# Patient Record
Sex: Female | Born: 1975 | Hispanic: Yes | Smoking: Never smoker
Health system: Southern US, Community
[De-identification: ages and names within clinical notes are randomized; demographics above are authoritative.]

## PROBLEM LIST (undated history)

## (undated) DIAGNOSIS — D649 Anemia, unspecified: Secondary | ICD-10-CM

## (undated) DIAGNOSIS — F419 Anxiety disorder, unspecified: Secondary | ICD-10-CM

## (undated) DIAGNOSIS — N840 Polyp of corpus uteri: Secondary | ICD-10-CM

## (undated) DIAGNOSIS — F32A Depression, unspecified: Secondary | ICD-10-CM

## (undated) DIAGNOSIS — G250 Essential tremor: Secondary | ICD-10-CM

## (undated) DIAGNOSIS — F909 Attention-deficit hyperactivity disorder, unspecified type: Secondary | ICD-10-CM

## (undated) DIAGNOSIS — R251 Tremor, unspecified: Secondary | ICD-10-CM

## (undated) HISTORY — DX: Tremor, unspecified: R25.1

## (undated) HISTORY — DX: Depression, unspecified: F32.A

## (undated) HISTORY — DX: Anxiety disorder, unspecified: F41.9

## (undated) HISTORY — DX: Attention-deficit hyperactivity disorder, unspecified type: F90.9

---

## 2018-08-29 NOTE — H&P (Signed)
Alvia GroveLissette Vanderford  DICTATION # [id number] CSN# [id number]   Meriel Picaichard M Gillie Crisci,

## 2018-08-30 NOTE — H&P (Signed)
NAME: Victoria Parker, Eulalie MEDICAL RECORD ZO:[id number] ACCOUNT [id number] DATE OF BIRTH:July 20, 1976 FACILITY: [facility] LOCATION:  PHYSICIAN:Tanis Hensarling Milana ObeyM. Latosha Gaylord, MD  HISTORY AND PHYSICAL  DATE OF ADMISSION:  09/11/2018  CHIEF COMPLAINT:  Menorrhagia, endometrial polyps, request permanent sterilization.  HISTORY OF PRESENT ILLNESS:  A 42 year old G2 P2, currently using condoms for contraception.  [place] for anemia and menorrhagia with a hemoglobin of 9.7.  Prolactin and estradiol normal, FSH 39.9.   I also did a CEA and CA-125 that were normal 04/2018.  Ultrasound showed that she had a small left serosal fibroid and endometrial polyps.  After our exam, she also requested tubal ligation at the time of D and C hysteroscopy.  This procedure including  the permanence of procedure failure rate of 2-3 per thousand, other risks regarding bleeding, infection, possible need for open surgery discussed with her.  The D and C MyoSure procedure reviewed likewise including risk of bleeding, infection or  requiring additional surgery, which she consented to.  PAST MEDICAL HISTORY:    ALLERGIES:  None.  MEDICATIONS:  Propranolol 10 mg.  PAST SURGICAL HISTORY:  Cesarean section x2.  SOCIAL HISTORY:  Denies alcohol, tobacco or drug use.  She is married.  REVIEW OF SYSTEMS:  Positive for anemia.  FAMILY HISTORY:  Otherwise negative.  PHYSICAL EXAMINATION: VITAL SIGNS:  Temperature 98.2, blood pressure 120/72. HEENT:  Unremarkable. NECK:  Supple, without masses. LUNGS:  Clear. HEART:  Regular rate and rhythm without murmurs, rubs or gallops noted. BREASTS:  Without masses. ABDOMEN:  Soft, flat, nontender. PELVIC:  Vulva, vagina, cervix normal.  Uterus mid position, nontender.  Adnexa negative. NEUROLOGIC:  Unremarkable.  IMPRESSION: 1.  Endometrial polyps with menorrhagia and history of anemia. 2.  Request permanent sterilization.  PLAN:  D  and C, hysteroscopy with MyoSure, laparoscopic tubal ligation with Filshie clips.  Procedure and risks discussed as above.  TN/NUANCE  D:08/29/2018 T:08/29/2018 JOB:[id number]

## 2018-09-05 ENCOUNTER — Encounter (HOSPITAL_BASED_OUTPATIENT_CLINIC_OR_DEPARTMENT_OTHER): Payer: Self-pay | Admitting: *Deleted

## 2018-09-05 ENCOUNTER — Other Ambulatory Visit: Payer: Self-pay

## 2018-09-05 NOTE — Progress Notes (Addendum)
Spoke w/ pt via phone for pre-op interview.  Npo after mn.  Arrive at 0530.  Needs cbc and urine preg. Will take propranolol am dos w/ sips of water.  Called and spoke w/ heather, or scheduler, to ask dr Marcelle Overlieholland if T&S needed.  ADDENDUM:  Received called back from heather,  Per dr Marcelle Overlieholland to T&S needed, ordered d/c'd in epic.

## 2018-09-10 NOTE — Anesthesia Preprocedure Evaluation (Addendum)
Anesthesia Evaluation  Patient identified by MRN, date of birth, ID band Patient awake    Reviewed: Allergy & Precautions, NPO status , Patient's Chart, lab work & pertinent test results  History of Anesthesia Complications Negative for: history of anesthetic complications  Airway Mallampati: I  TM Distance: >3 FB Neck ROM: Full    Dental  (+) Dental Advisory Given, Teeth Intact   Pulmonary neg pulmonary ROS,    breath sounds clear to auscultation       Cardiovascular negative cardio ROS   Rhythm:Regular Rate:Normal     Neuro/Psych  Essential tremor  negative psych ROS   GI/Hepatic negative GI ROS, Neg liver ROS,   Endo/Other  negative endocrine ROS  Renal/GU negative Renal ROS     Musculoskeletal negative musculoskeletal ROS (+)   Abdominal   Peds  Hematology  (+) anemia ,   Anesthesia Other Findings   Reproductive/Obstetrics                            Anesthesia Physical Anesthesia Plan  ASA: I  Anesthesia Plan: General   Post-op Pain Management:    Induction: Intravenous  PONV Risk Score and Plan: 4 or greater and Treatment may vary due to age or medical condition, Ondansetron, Scopolamine patch - Pre-op, Dexamethasone and Midazolam  Airway Management Planned: Oral ETT  Additional Equipment: None  Intra-op Plan:   Post-operative Plan: Extubation in OR  Informed Consent: I have reviewed the patients History and Physical, chart, labs and discussed the procedure including the risks, benefits and alternatives for the proposed anesthesia with the patient or authorized representative who has indicated his/her understanding and acceptance.   Dental advisory given  Plan Discussed with: CRNA and Anesthesiologist  Anesthesia Plan Comments:        Anesthesia Quick Evaluation

## 2018-09-11 ENCOUNTER — Observation Stay (HOSPITAL_BASED_OUTPATIENT_CLINIC_OR_DEPARTMENT_OTHER)
Admission: RE | Admit: 2018-09-11 | Discharge: 2018-09-11 | Disposition: A | Discharge status: Home / Self Care | Payer: 59 | Source: Ambulatory Visit | Attending: Obstetrics and Gynecology | Admitting: Obstetrics and Gynecology

## 2018-09-11 ENCOUNTER — Observation Stay (HOSPITAL_BASED_OUTPATIENT_CLINIC_OR_DEPARTMENT_OTHER): Payer: 59 | Admitting: Anesthesiology

## 2018-09-11 ENCOUNTER — Other Ambulatory Visit: Payer: Self-pay

## 2018-09-11 ENCOUNTER — Encounter (HOSPITAL_BASED_OUTPATIENT_CLINIC_OR_DEPARTMENT_OTHER): Payer: Self-pay | Admitting: *Deleted

## 2018-09-11 ENCOUNTER — Encounter (HOSPITAL_BASED_OUTPATIENT_CLINIC_OR_DEPARTMENT_OTHER)
Admission: RE | Disposition: A | Discharge status: Home / Self Care | Payer: Self-pay | Source: Ambulatory Visit | Attending: Obstetrics and Gynecology

## 2018-09-11 DIAGNOSIS — N840 Polyp of corpus uteri: Secondary | ICD-10-CM | POA: Diagnosis not present

## 2018-09-11 DIAGNOSIS — N920 Excessive and frequent menstruation with regular cycle: Principal | ICD-10-CM | POA: Diagnosis present

## 2018-09-11 DIAGNOSIS — Z302 Encounter for sterilization: Secondary | ICD-10-CM | POA: Insufficient documentation

## 2018-09-11 DIAGNOSIS — N736 Female pelvic peritoneal adhesions (postinfective): Secondary | ICD-10-CM | POA: Diagnosis not present

## 2018-09-11 HISTORY — DX: Polyp of corpus uteri: N84.0

## 2018-09-11 HISTORY — PX: DILATATION & CURETTAGE/HYSTEROSCOPY WITH MYOSURE: SHX6511

## 2018-09-11 HISTORY — PX: LAPAROSCOPIC TUBAL LIGATION: SHX1937

## 2018-09-11 HISTORY — DX: Anemia, unspecified: D64.9

## 2018-09-11 HISTORY — DX: Essential tremor: G25.0

## 2018-09-11 LAB — CBC
HCT: 32 % — ABNORMAL LOW (ref 36.0–46.0)
Hemoglobin: 9.9 g/dL — ABNORMAL LOW (ref 12.0–15.0)
MCH: 26.9 pg (ref 26.0–34.0)
MCHC: 30.9 g/dL (ref 30.0–36.0)
MCV: 87 fL (ref 80.0–100.0)
PLATELETS: 283 10*3/uL (ref 150–400)
RBC: 3.68 MIL/uL — ABNORMAL LOW (ref 3.87–5.11)
RDW: 13.4 % (ref 11.5–15.5)
WBC: 5.6 10*3/uL (ref 4.0–10.5)
nRBC: 0 % (ref 0.0–0.2)

## 2018-09-11 LAB — POCT PREGNANCY, URINE: PREG TEST UR: NEGATIVE

## 2018-09-11 SURGERY — LIGATION, FALLOPIAN TUBE, LAPAROSCOPIC
Anesthesia: General

## 2018-09-11 MED ORDER — FENTANYL CITRATE (PF) 100 MCG/2ML IJ SOLN
INTRAMUSCULAR | Status: DC | PRN
  Administered 2018-09-11: 25 ug via INTRAVENOUS
  Administered 2018-09-11: 50 ug via INTRAVENOUS
  Administered 2018-09-11: 25 ug via INTRAVENOUS

## 2018-09-11 MED ORDER — FENTANYL CITRATE (PF) 100 MCG/2ML IJ SOLN
INTRAMUSCULAR | Status: AC
  Filled 2018-09-11: qty 2

## 2018-09-11 MED ORDER — PROMETHAZINE HCL 25 MG/ML IJ SOLN
INTRAMUSCULAR | Status: AC
  Filled 2018-09-11: qty 1

## 2018-09-11 MED ORDER — FENTANYL CITRATE (PF) 100 MCG/2ML IJ SOLN
25.0000 ug | INTRAMUSCULAR | Status: DC | PRN
  Administered 2018-09-11: 50 ug via INTRAVENOUS
  Administered 2018-09-11: 25 ug via INTRAVENOUS
  Filled 2018-09-11: qty 1

## 2018-09-11 MED ORDER — DEXAMETHASONE SODIUM PHOSPHATE 10 MG/ML IJ SOLN
INTRAMUSCULAR | Status: DC | PRN
  Administered 2018-09-11: 10 mg via INTRAVENOUS

## 2018-09-11 MED ORDER — PROPOFOL 10 MG/ML IV BOLUS
INTRAVENOUS | Status: AC
  Filled 2018-09-11: qty 20

## 2018-09-11 MED ORDER — OXYCODONE HCL 5 MG/5ML PO SOLN
5.0000 mg | Freq: Once | ORAL | Status: AC | PRN
  Filled 2018-09-11: qty 5

## 2018-09-11 MED ORDER — KETOROLAC TROMETHAMINE 30 MG/ML IJ SOLN
INTRAMUSCULAR | Status: AC
  Filled 2018-09-11: qty 1

## 2018-09-11 MED ORDER — ONDANSETRON HCL 4 MG/2ML IJ SOLN
INTRAMUSCULAR | Status: AC
  Filled 2018-09-11: qty 4

## 2018-09-11 MED ORDER — DEXTROSE IN LACTATED RINGERS 5 % IV SOLN
INTRAVENOUS | Status: DC
  Filled 2018-09-11: qty 1000

## 2018-09-11 MED ORDER — PROMETHAZINE HCL 25 MG/ML IJ SOLN
6.2500 mg | INTRAMUSCULAR | Status: DC | PRN
  Administered 2018-09-11: 6.25 mg via INTRAVENOUS
  Filled 2018-09-11: qty 1

## 2018-09-11 MED ORDER — ROCURONIUM BROMIDE 10 MG/ML (PF) SYRINGE
PREFILLED_SYRINGE | INTRAVENOUS | Status: AC
  Filled 2018-09-11: qty 10

## 2018-09-11 MED ORDER — LACTATED RINGERS IV SOLN
INTRAVENOUS | Status: DC
  Administered 2018-09-11 (×3): via INTRAVENOUS
  Filled 2018-09-11: qty 1000

## 2018-09-11 MED ORDER — PROPOFOL 10 MG/ML IV BOLUS
INTRAVENOUS | Status: DC | PRN
  Administered 2018-09-11: 140 mg via INTRAVENOUS

## 2018-09-11 MED ORDER — SCOPOLAMINE 1 MG/3DAYS TD PT72
MEDICATED_PATCH | TRANSDERMAL | Status: DC | PRN
  Administered 2018-09-11: 1 via TRANSDERMAL

## 2018-09-11 MED ORDER — SCOPOLAMINE 1 MG/3DAYS TD PT72
MEDICATED_PATCH | TRANSDERMAL | Status: AC
  Filled 2018-09-11: qty 1

## 2018-09-11 MED ORDER — MIDAZOLAM HCL 5 MG/5ML IJ SOLN
INTRAMUSCULAR | Status: DC | PRN
  Administered 2018-09-11: 2 mg via INTRAVENOUS

## 2018-09-11 MED ORDER — SUGAMMADEX SODIUM 200 MG/2ML IV SOLN
INTRAVENOUS | Status: DC | PRN
  Administered 2018-09-11: 100 mg via INTRAVENOUS

## 2018-09-11 MED ORDER — LIDOCAINE 2% (20 MG/ML) 5 ML SYRINGE
INTRAMUSCULAR | Status: DC | PRN
  Administered 2018-09-11: 80 mg via INTRAVENOUS

## 2018-09-11 MED ORDER — OXYCODONE-ACETAMINOPHEN 5-325 MG PO TABS: 1.0000 | ORAL_TABLET | ORAL | 0 refills | Status: DC | PRN

## 2018-09-11 MED ORDER — SUGAMMADEX SODIUM 200 MG/2ML IV SOLN
INTRAVENOUS | Status: AC
  Filled 2018-09-11: qty 2

## 2018-09-11 MED ORDER — SODIUM CHLORIDE 0.9 % IV SOLN
INTRAVENOUS | Status: AC
  Filled 2018-09-11: qty 2

## 2018-09-11 MED ORDER — SODIUM CHLORIDE 0.9 % IV SOLN
2.0000 g | INTRAVENOUS | Status: AC
  Administered 2018-09-11: 2 g via INTRAVENOUS
  Filled 2018-09-11: qty 2

## 2018-09-11 MED ORDER — ROCURONIUM BROMIDE 100 MG/10ML IV SOLN
INTRAVENOUS | Status: DC | PRN
  Administered 2018-09-11: 50 mg via INTRAVENOUS

## 2018-09-11 MED ORDER — SODIUM CHLORIDE (PF) 0.9 % IJ SOLN
INTRAMUSCULAR | Status: AC
  Filled 2018-09-11: qty 10

## 2018-09-11 MED ORDER — DEXAMETHASONE SODIUM PHOSPHATE 10 MG/ML IJ SOLN
INTRAMUSCULAR | Status: AC
  Filled 2018-09-11: qty 1

## 2018-09-11 MED ORDER — MIDAZOLAM HCL 2 MG/2ML IJ SOLN
INTRAMUSCULAR | Status: AC
  Filled 2018-09-11: qty 2

## 2018-09-11 MED ORDER — LIDOCAINE HCL 1 % IJ SOLN
INTRAMUSCULAR | Status: DC | PRN
  Administered 2018-09-11: 10 mL

## 2018-09-11 MED ORDER — KETOROLAC TROMETHAMINE 30 MG/ML IJ SOLN
INTRAMUSCULAR | Status: DC | PRN
  Administered 2018-09-11: 30 mg via INTRAVENOUS

## 2018-09-11 MED ORDER — OXYCODONE HCL 5 MG PO TABS
5.0000 mg | ORAL_TABLET | Freq: Once | ORAL | Status: AC | PRN
  Administered 2018-09-11: 5 mg via ORAL
  Filled 2018-09-11: qty 1

## 2018-09-11 MED ORDER — IBUPROFEN 200 MG PO TABS: 800.0000 mg | ORAL_TABLET | Freq: Three times a day (TID) | ORAL | 1 refills | Status: AC | PRN

## 2018-09-11 MED ORDER — ONDANSETRON HCL 4 MG/2ML IJ SOLN
INTRAMUSCULAR | Status: DC | PRN
  Administered 2018-09-11 (×2): 4 mg via INTRAVENOUS

## 2018-09-11 MED ORDER — SODIUM CHLORIDE 0.9 % IR SOLN
Status: DC | PRN
  Administered 2018-09-11: 3000 mL

## 2018-09-11 MED ORDER — BUPIVACAINE HCL (PF) 0.5 % IJ SOLN
INTRAMUSCULAR | Status: DC | PRN
  Administered 2018-09-11: 25 mL

## 2018-09-11 MED ORDER — OXYCODONE HCL 5 MG PO TABS
ORAL_TABLET | ORAL | Status: AC
  Filled 2018-09-11: qty 1

## 2018-09-11 MED ORDER — PHENYLEPHRINE 40 MCG/ML (10ML) SYRINGE FOR IV PUSH (FOR BLOOD PRESSURE SUPPORT)
PREFILLED_SYRINGE | INTRAVENOUS | Status: DC | PRN
  Administered 2018-09-11: 80 ug via INTRAVENOUS
  Administered 2018-09-11 (×3): 40 ug via INTRAVENOUS

## 2018-09-11 MED ORDER — LIDOCAINE 2% (20 MG/ML) 5 ML SYRINGE
INTRAMUSCULAR | Status: AC
  Filled 2018-09-11: qty 5

## 2018-09-11 SURGICAL SUPPLY — 54 items
CANISTER SUCT 3000ML PPV (MISCELLANEOUS) ×4 IMPLANT
CATH ROBINSON RED A/P 16FR (CATHETERS) ×4 IMPLANT
CLIP FILSHIE TUBAL LIGA STRL (Clip) ×4 IMPLANT
CLOTH BEACON ORANGE TIMEOUT ST (SAFETY) ×4 IMPLANT
COUNTER NEEDLE 1200 MAGNETIC (NEEDLE) ×4 IMPLANT
COVER MAYO STAND STRL (DRAPES) ×4 IMPLANT
COVER WAND RF STERILE (DRAPES) ×4 IMPLANT
DERMABOND ADVANCED (GAUZE/BANDAGES/DRESSINGS) ×2
DERMABOND ADVANCED .7 DNX12 (GAUZE/BANDAGES/DRESSINGS) ×2 IMPLANT
DEVICE MYOSURE LITE (MISCELLANEOUS) IMPLANT
DEVICE MYOSURE REACH (MISCELLANEOUS) IMPLANT
DILATOR CANAL MILEX (MISCELLANEOUS) IMPLANT
DRSG COVADERM PLUS 2X2 (GAUZE/BANDAGES/DRESSINGS) IMPLANT
DRSG OPSITE POSTOP 3X4 (GAUZE/BANDAGES/DRESSINGS) ×2 IMPLANT
DRSG TELFA 3X8 NADH (GAUZE/BANDAGES/DRESSINGS) IMPLANT
DURAPREP 26ML APPLICATOR (WOUND CARE) ×4 IMPLANT
ELECT REM PT RETURN 9FT ADLT (ELECTROSURGICAL) ×4
ELECTRODE REM PT RTRN 9FT ADLT (ELECTROSURGICAL) ×2 IMPLANT
GAUZE 4X4 16PLY RFD (DISPOSABLE) ×4 IMPLANT
GLOVE BIO SURGEON STRL SZ7 (GLOVE) ×8 IMPLANT
GLOVE BIOGEL PI IND STRL 7.0 (GLOVE) ×2 IMPLANT
GLOVE BIOGEL PI INDICATOR 7.0 (GLOVE) ×2
GOWN STRL REUS W/TWL LRG LVL3 (GOWN DISPOSABLE) ×8 IMPLANT
IV NS IRRIG 3000ML ARTHROMATIC (IV SOLUTION) ×8 IMPLANT
KIT PROCEDURE FLUENT (KITS) ×4 IMPLANT
KIT TURNOVER CYSTO (KITS) ×4 IMPLANT
MYOSURE XL FIBROID (MISCELLANEOUS)
NDL HYPO 25X1 1.5 SAFETY (NEEDLE) IMPLANT
NEEDLE HYPO 25X1 1.5 SAFETY (NEEDLE) ×4 IMPLANT
NEEDLE INSUFFLATION 120MM (ENDOMECHANICALS) ×4 IMPLANT
NS IRRIG 500ML POUR BTL (IV SOLUTION) ×4 IMPLANT
PACK LAPAROSCOPY BASIN (CUSTOM PROCEDURE TRAY) ×4 IMPLANT
PACK TRENDGUARD 450 HYBRID PRO (MISCELLANEOUS) IMPLANT
PACK VAGINAL MINOR WOMEN LF (CUSTOM PROCEDURE TRAY) ×4 IMPLANT
PAD DRESSING TELFA 3X8 NADH (GAUZE/BANDAGES/DRESSINGS) IMPLANT
PAD OB MATERNITY 4.3X12.25 (PERSONAL CARE ITEMS) ×4 IMPLANT
PAD PREP 24X48 CUFFED NSTRL (MISCELLANEOUS) ×4 IMPLANT
PADDING ION DISPOSABLE (MISCELLANEOUS) ×4 IMPLANT
SEAL ROD LENS SCOPE MYOSURE (ABLATOR) ×4 IMPLANT
SEALER TISSUE G2 CVD JAW 45CM (ENDOMECHANICALS) IMPLANT
SET IRRIG TUBING LAPAROSCOPIC (IRRIGATION / IRRIGATOR) IMPLANT
SUT MNCRL AB 3-0 PS2 18 (SUTURE) IMPLANT
SUT MNCRL AB 4-0 PS2 18 (SUTURE) ×4 IMPLANT
SUT VICRYL 0 UR6 27IN ABS (SUTURE) ×2 IMPLANT
SYR 10ML LL (SYRINGE) ×2 IMPLANT
SYSTEM TISS REMOVAL MYOSURE XL (MISCELLANEOUS) IMPLANT
TOWEL OR 17X24 6PK STRL BLUE (TOWEL DISPOSABLE) ×8 IMPLANT
TRENDGUARD 450 HYBRID PRO PACK (MISCELLANEOUS) ×4
TROCAR OPTI TIP 5M 100M (ENDOMECHANICALS) ×2 IMPLANT
TROCAR XCEL BLUNT TIP 100MML (ENDOMECHANICALS) IMPLANT
TROCAR XCEL DIL TIP R 11M (ENDOMECHANICALS) ×4 IMPLANT
TUBING INSUF HEATED (TUBING) ×4 IMPLANT
WARMER LAPAROSCOPE (MISCELLANEOUS) ×4 IMPLANT
WATER STERILE IRR 500ML POUR (IV SOLUTION) ×4 IMPLANT

## 2018-09-11 NOTE — Anesthesia Postprocedure Evaluation (Signed)
Anesthesia Post Note  Patient: Victoria Parker  Procedure(s) Performed: LAPAROSCOPIC TUBAL LIGATION with Filshie clips (Bilateral ) DILATATION & CURETTAGE/HYSTEROSCOPY (N/A )     Patient location during evaluation: PACU Anesthesia Type: General Level of consciousness: awake and alert Pain management: pain level controlled Vital Signs Assessment: post-procedure vital signs reviewed and stable Respiratory status: spontaneous breathing, nonlabored ventilation and respiratory function stable Cardiovascular status: blood pressure returned to baseline and stable Postop Assessment: no apparent nausea or vomiting Anesthetic complications: no    Last Vitals:  Vitals:   09/11/18 0930 09/11/18 0945  BP: 93/63 (!) 88/60  Pulse: (!) 56 (!) 58  Resp: 15 13  Temp:    SpO2: 100% 98%    Last Pain:  Vitals:   09/11/18 1145  TempSrc:   PainSc: 4                  Beryle Lathehomas E Brock

## 2018-09-11 NOTE — Op Note (Signed)
Preoperative diagnosis: Menorrhagia, pelvic pain, request permanent sterilization,  Postoperative diagnosis: Same plus pelvic adhesions  Procedure: The patient taken to the operating room after an adequate level of general anesthesia was obtained with patient's legs in stirrups the abdomen perineum and vagina were prepped and draped in the bladder was drained this was done after appropriate timeouts were taken.  Weighted speculum was positioned cervix grasped with a tenaculum paracervical block was then instituted infiltrating at 3 9:00 submucosally 5 to 7 cc of 1% plain Xylocaine at each side after negative aspiration.  Uterus sounded to 8 cm progressively dilated to a 25 Pratt dilator, the smaller.amateur diagnostic hysteroscope was in position good view of the fundus I did not specifically see any well-defined polyps there was some minimal endometrial buildup the cavity otherwise looked normal sharp curettage was carried out minimal tissue was sent to pathology.  Hulka tenaculum was positioned.   Attention directed to the abdomen where the subumbilical area was infiltrated with quarter percent Marcaine plain a small incision was made and the varies needle was introduced without difficulty.  After 2-1/2 L pneumoperitoneum was then created, lap scopic trocar and sleeve were then introduced without difficulty.  There was no evidence of any bleeding or trauma.  The patient was then placed in Trendelenburg with the uterus anteflexed, the findings as noted:   There was some omental adhesions into the lower anterior abdominal wall there was some significant left adnexal adhesions with the left fimbriated and stuck to the left pelvic sidewall and other adhesions on that left side the right side appeared to be normal cul-de-sac was free and clear.  Under direct visualization 3 fingerbreadths above the symphysis of 5 mm trocar was inserted for better traction and blunt probe was positioned.  Half percent Marcaine  plain were then dripped across each tube from the cornu to the fimbriated end 4 to 5 cc on each side.  The right tube was positively identified and traced to the fimbriated end, regrasped 2 cm from the cornua Filshie clip applied to the right angle completely engulfing the tube excellent application.  On the left side the tube was traced to the distal end which again was adherent into the left pelvic sidewall but this was positively identified and a Filshie clip applied 2 cm from the cornu at a right angle with good application.  This application was photo documented.  Prior to closure sponge, needle, instrument counts reported as correct x2.  Instruments were removed, gas allowed to escape, the upper incision closed with a 4-0 Monocryl subcuticular skin closure and Dermabond on the lower she tolerated this well went to recovery room in good condition.  Dictated with Dragon Medical 1  Meriel Picaichard M Ragan Duhon MD

## 2018-09-11 NOTE — Transfer of Care (Signed)
Immediate Anesthesia Transfer of Care Note  Patient: Victoria Parker  Procedure(s) Performed: LAPAROSCOPIC TUBAL LIGATION with Filshie clips (Bilateral ) DILATATION & CURETTAGE/HYSTEROSCOPY (N/A )  Patient Location: PACU  Anesthesia Type:General  Level of Consciousness: drowsy and patient cooperative  Airway & Oxygen Therapy: Patient Spontanous Breathing and Patient connected to face mask oxygen  Post-op Assessment: Report given to RN and Post -op Vital signs reviewed and stable  Post vital signs: Reviewed and stable  Last Vitals:  Vitals Value Taken Time  BP 100/66 09/11/2018  8:47 AM  Temp    Pulse 80 09/11/2018  8:50 AM  Resp 14 09/11/2018  8:50 AM  SpO2 100 % 09/11/2018  8:50 AM  Vitals shown include unvalidated device data.  Last Pain:  Vitals:   09/11/18 0556  TempSrc:   PainSc: 0-No pain      Patients Stated Pain Goal: 7 (09/11/18 0556)  Complications: No apparent anesthesia complications

## 2018-09-11 NOTE — Discharge Instructions (Signed)
DISCHARGE INSTRUCTIONS: D&C hysteroscopy/ bilateral tubal ligation/ ablation  **You may begin taking Ibuprofen/Motrin/Advil after 2:23pm today**  The following instructions have been prepared to help you care for yourself upon your return home today.  Wound care:  Do not get the incision wet for the first 24 hours. The incision should be kept clean and dry.  The Band-Aids or dressings may be removed the 2nd day after surgery(Wednesday).  Should the incision become sore, red, and swollen after the first week, check with your doctor.  Personal hygiene:  Shower the day after your procedure.  Activity and limitations:  Do NOT drive or operate any equipment today.  Do NOT lift anything more than 15 pounds for 2-3 weeks after surgery.  Do NOT rest in bed all day.  Walking is encouraged. Walk each day, starting slowly with 5-minute walks 3 or 4 times a day. Slowly increase the length of your walks.  Walk up and down stairs slowly.  Do NOT do strenuous activities, such as golfing, playing tennis, bowling, running, biking, weight lifting, gardening, mowing, or vacuuming for 2-4 weeks. Ask your doctor when it is okay to start.  Diet: Eat a light meal as desired this evening. You may resume your usual diet tomorrow.  Return to work: This is dependent on the type of work you do. For the most part you can return to a desk job within a week of surgery. If you are more active at work, please discuss this with your doctor.  What to expect after your surgery: You may have a slight burning sensation when you urinate on the first day. You may have a very small amount of blood in the urine. Expect to have a small amount of vaginal discharge/light bleeding for 1-2 weeks. It is not unusual to have abdominal soreness and bruising for up to 2 weeks. You may be tired and need more rest for about 1 week. You may experience shoulder pain for 24-72 hours. Lying flat in bed may relieve it.  Call your doctor  for any of the following:  Develop a fever of 100.4 or greater  Inability to urinate 6 hours after discharge from hospital  Severe pain not relieved by pain medications  Persistent of heavy bleeding at incision site  Redness or swelling around incision site after a week  Increasing nausea or vomiting    Post Anesthesia Home Care Instructions  Activity: Get plenty of rest for the remainder of the day. A responsible adult should stay with you for 24 hours following the procedure.  For the next 24 hours, DO NOT: -Drive a car -Advertising copywriter -Drink alcoholic beverages -Take any medication unless instructed by your physician -Make any legal decisions or sign important papers.  Meals: Start with liquid foods such as gelatin or soup. Progress to regular foods as tolerated. Avoid greasy, spicy, heavy foods. If nausea and/or vomiting occur, drink only clear liquids until the nausea and/or vomiting subsides. Call your physician if vomiting continues.  Special Instructions/Symptoms: Your throat may feel dry or sore from the anesthesia or the breathing tube placed in your throat during surgery. If this causes discomfort, gargle with warm salt water. The discomfort should disappear within 24 hours.  If you had a scopolamine patch placed behind your ear for the management of post- operative nausea and/or vomiting:  1. The medication in the patch is effective for 72 hours, after which it should be removed.  Wrap patch in a tissue and discard in the trash.  Wash hands thoroughly with soap and water. 2. You may remove the patch earlier than 72 hours if you experience unpleasant side effects which may include dry mouth, dizziness or visual disturbances. 3. Avoid touching the patch. Wash your hands with soap and water after contact with the patch.

## 2018-09-11 NOTE — Progress Notes (Signed)
The patient was re-examined with no change in status 

## 2018-09-11 NOTE — Anesthesia Procedure Notes (Signed)
Procedure Name: Intubation Date/Time: 09/11/2018 7:36 AM Performed by: Marny Lowensteinapozzi, Autym Siess W, CRNA Pre-anesthesia Checklist: Patient identified, Emergency Drugs available, Suction available and Patient being monitored Patient Re-evaluated:Patient Re-evaluated prior to induction Oxygen Delivery Method: Circle system utilized Preoxygenation: Pre-oxygenation with 100% oxygen Induction Type: IV induction Ventilation: Mask ventilation without difficulty Laryngoscope Size: Miller and 2 Grade View: Grade I Tube type: Oral Tube size: 7.0 mm Number of attempts: 1 Airway Equipment and Method: Stylet Placement Confirmation: ETT inserted through vocal cords under direct vision,  positive ETCO2 and breath sounds checked- equal and bilateral Secured at: 20 cm Tube secured with: Tape Dental Injury: Teeth and Oropharynx as per pre-operative assessment

## 2018-09-12 ENCOUNTER — Encounter (HOSPITAL_BASED_OUTPATIENT_CLINIC_OR_DEPARTMENT_OTHER): Payer: Self-pay | Admitting: Obstetrics and Gynecology

## 2020-04-01 ENCOUNTER — Other Ambulatory Visit: Payer: Self-pay | Admitting: Endocrinology

## 2020-04-01 DIAGNOSIS — Z1231 Encounter for screening mammogram for malignant neoplasm of breast: Secondary | ICD-10-CM

## 2020-04-09 ENCOUNTER — Other Ambulatory Visit: Payer: Self-pay

## 2020-04-09 ENCOUNTER — Ambulatory Visit
Admission: RE | Admit: 2020-04-09 | Discharge: 2020-04-09 | Disposition: A | Discharge status: Home / Self Care | Payer: PRIVATE HEALTH INSURANCE | Source: Ambulatory Visit | Attending: Endocrinology | Admitting: Endocrinology

## 2020-04-09 DIAGNOSIS — Z1231 Encounter for screening mammogram for malignant neoplasm of breast: Secondary | ICD-10-CM

## 2021-08-17 IMAGING — MG DIGITAL SCREENING BILAT W/ TOMO W/ CAD
8 series · 8 of 24 positions shown · non-contrast
Comparison: None.

CLINICAL DATA: Screening.

EXAM:
DIGITAL SCREENING BILATERAL MAMMOGRAM WITH TOMO AND CAD

[R MLO synth-2D]
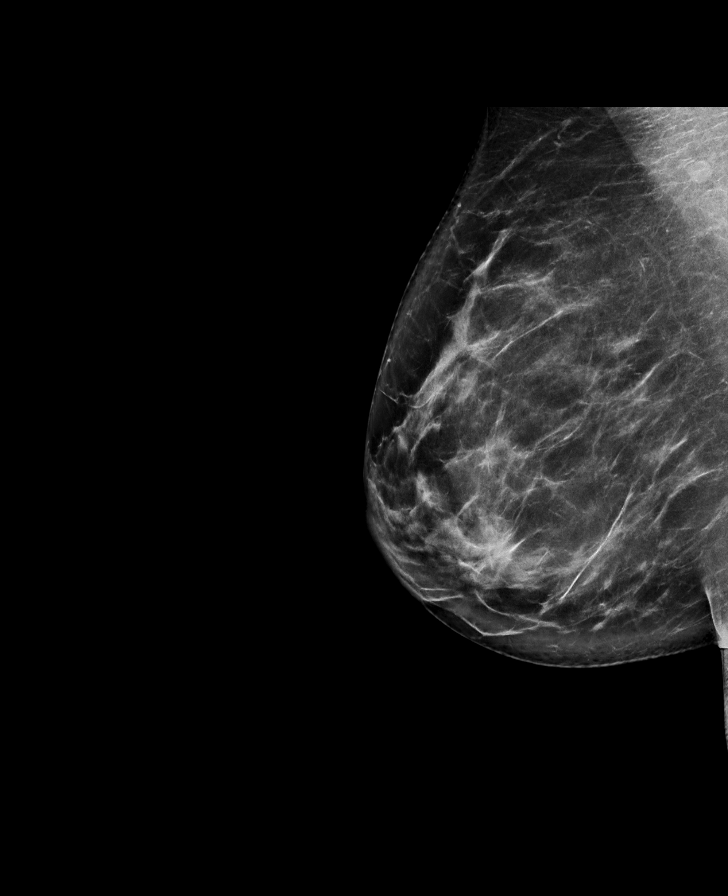

[L MLO synth-2D]
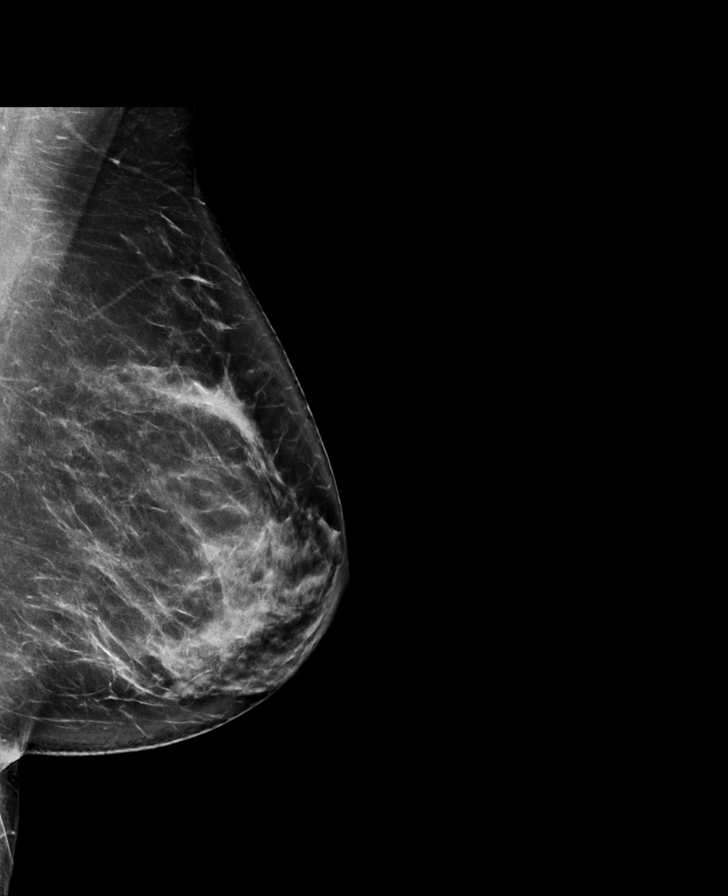

[L CC synth-2D]
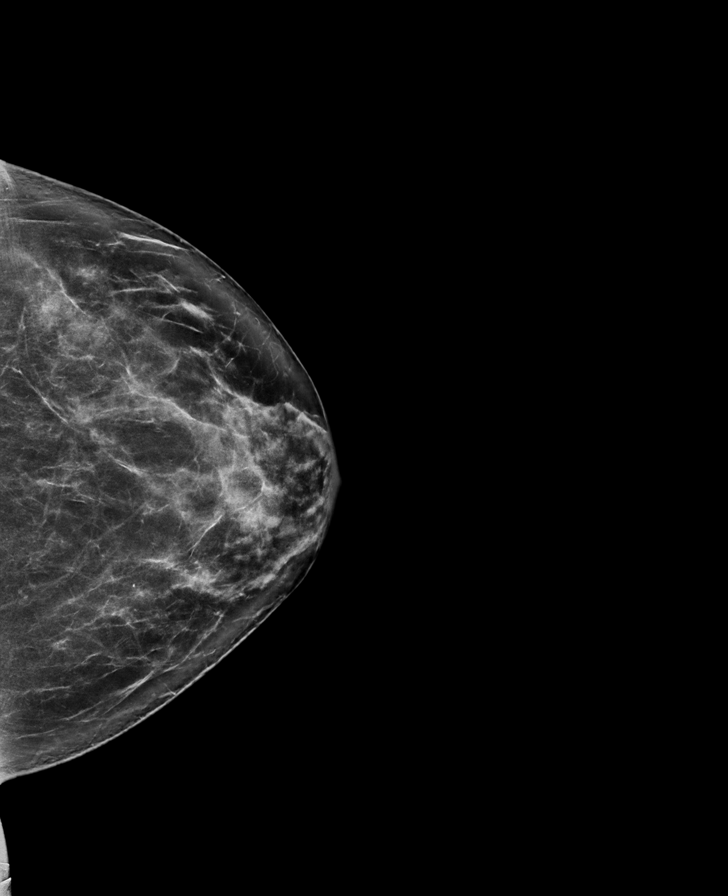

[R CC synth-2D]
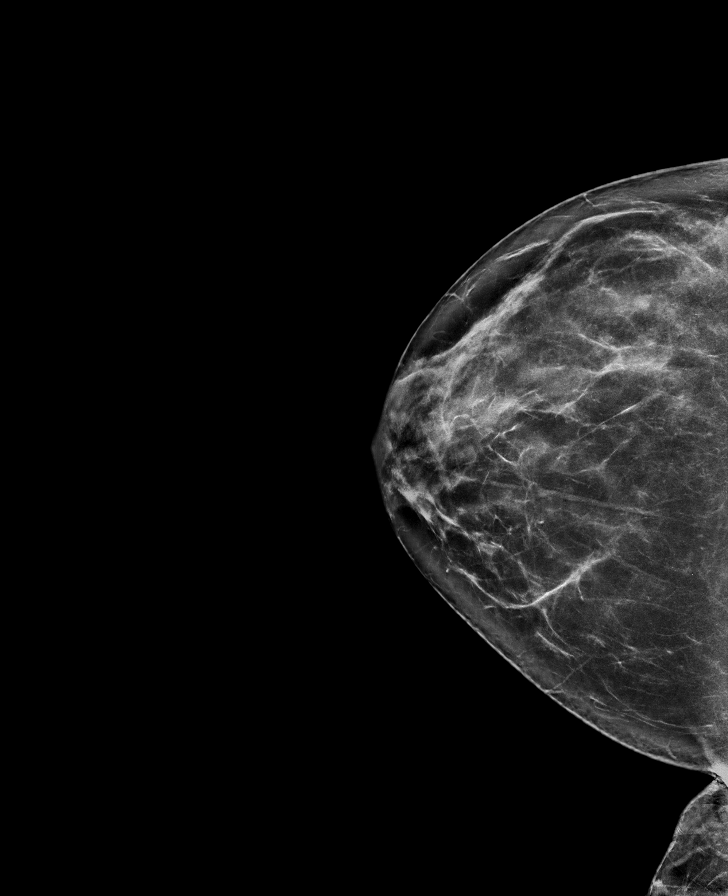

[R MLO tomo · tomo slice 47/92.0]
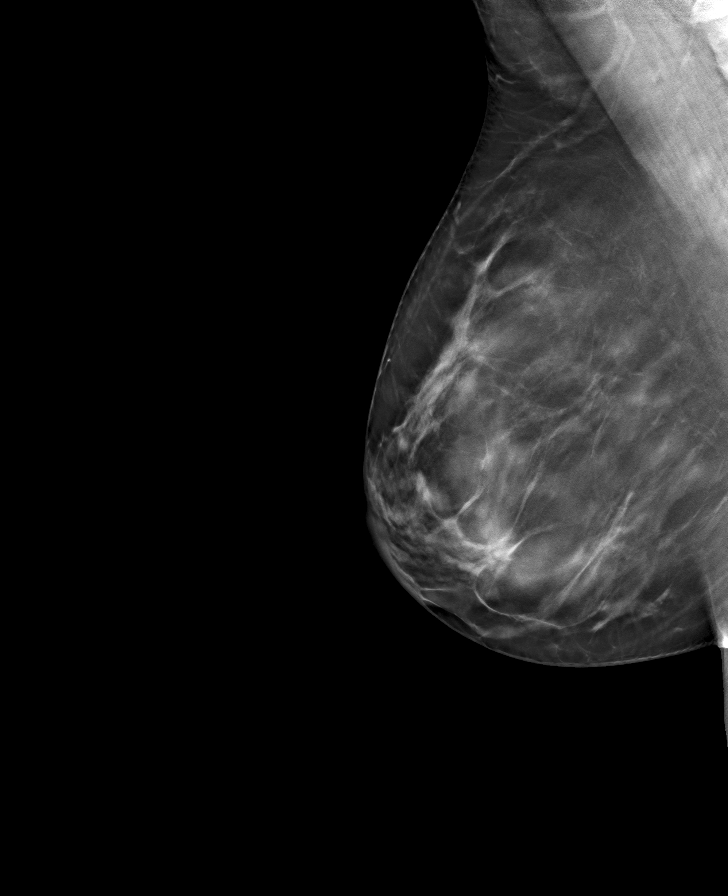

[R CC tomo · tomo slice 43/84.0]
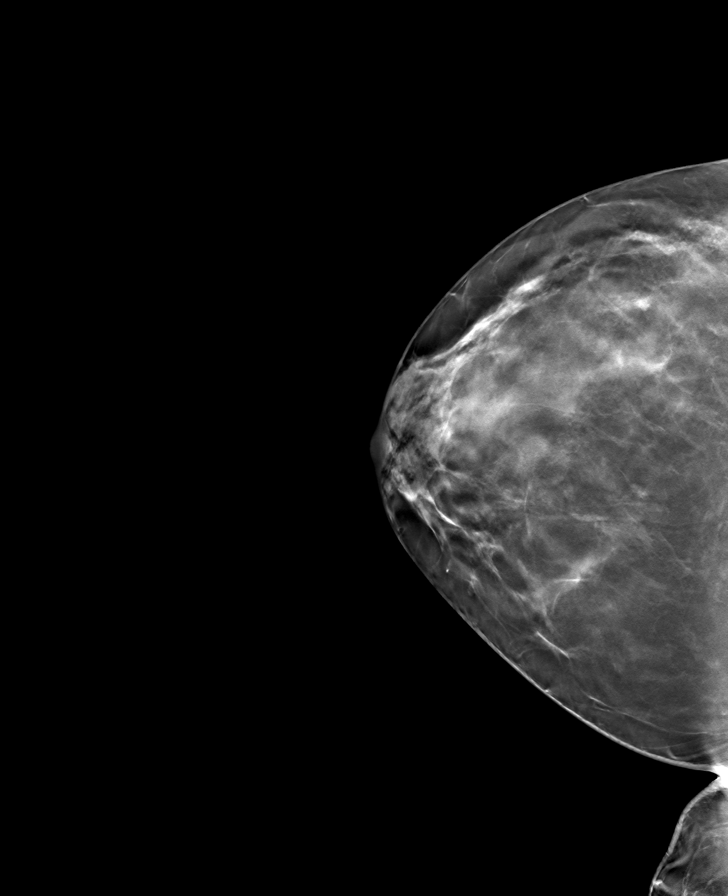

[L CC tomo · tomo slice 42/83.0]
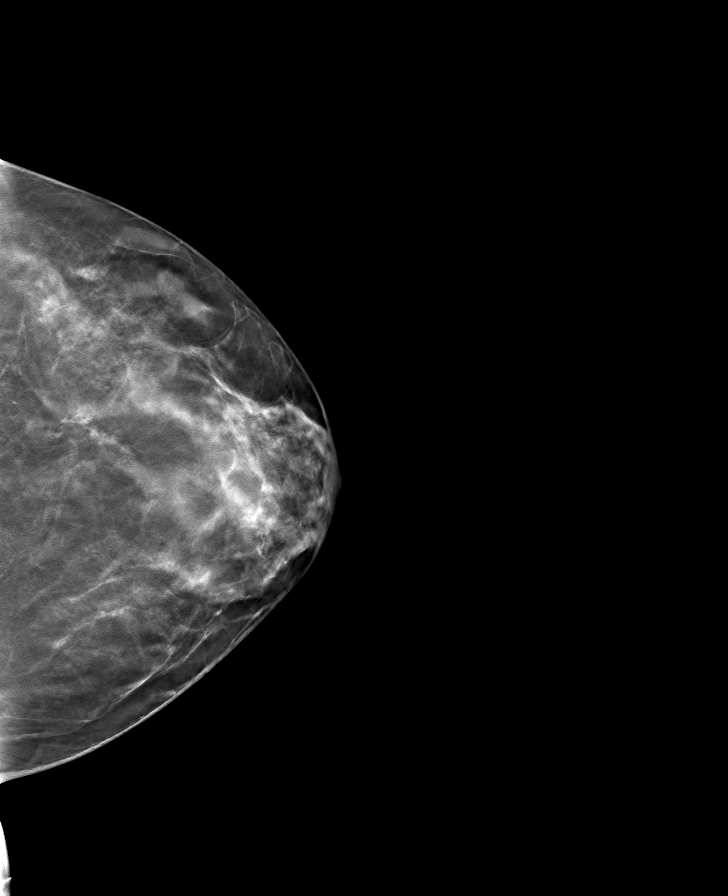

[L MLO tomo · tomo slice 45/88.0]
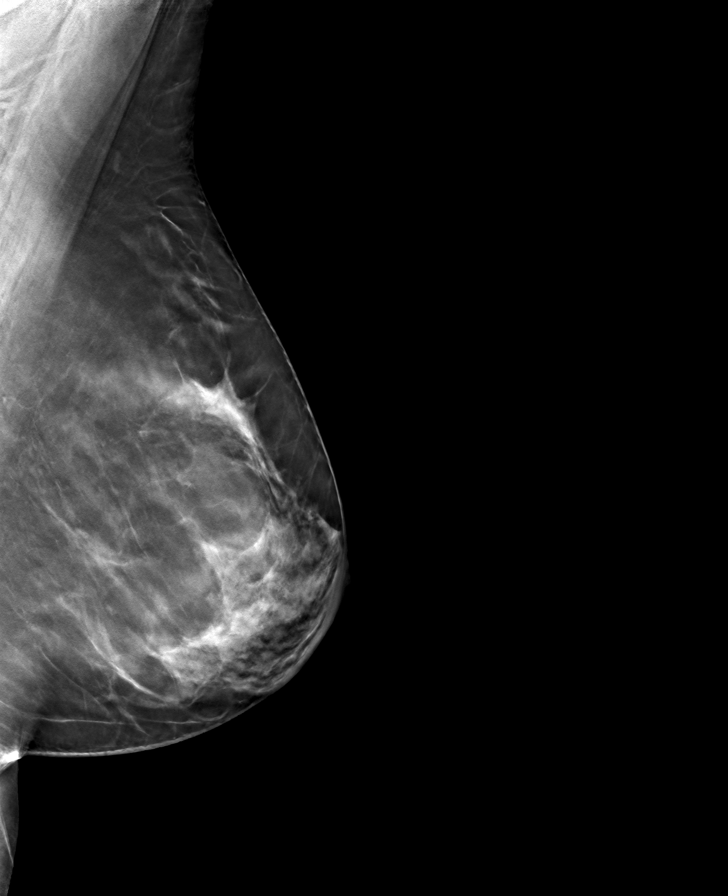

[8 of 24 positions shown; findings below may reference images not displayed]

ACR Breast Density Category b: There are scattered areas of
fibroglandular density.
FINDINGS: There are no findings suspicious for malignancy. Images were
processed with CAD.
IMPRESSION: No mammographic evidence of malignancy. A result letter of this
screening mammogram will be mailed directly to the patient.

RECOMMENDATION:
Screening mammogram in one year. (Code:Y5-G-EJ6)

BI-RADS CATEGORY  1: Negative.

## 2022-10-07 DIAGNOSIS — F4321 Adjustment disorder with depressed mood: Secondary | ICD-10-CM | POA: Diagnosis not present

## 2022-10-07 DIAGNOSIS — F900 Attention-deficit hyperactivity disorder, predominantly inattentive type: Secondary | ICD-10-CM | POA: Diagnosis not present

## 2022-10-21 DIAGNOSIS — F4321 Adjustment disorder with depressed mood: Secondary | ICD-10-CM | POA: Diagnosis not present

## 2022-10-21 DIAGNOSIS — F4322 Adjustment disorder with anxiety: Secondary | ICD-10-CM | POA: Diagnosis not present

## 2022-10-29 DIAGNOSIS — F4322 Adjustment disorder with anxiety: Secondary | ICD-10-CM | POA: Diagnosis not present

## 2022-10-29 DIAGNOSIS — F900 Attention-deficit hyperactivity disorder, predominantly inattentive type: Secondary | ICD-10-CM | POA: Diagnosis not present

## 2022-10-29 DIAGNOSIS — F4321 Adjustment disorder with depressed mood: Secondary | ICD-10-CM | POA: Diagnosis not present

## 2022-11-02 DIAGNOSIS — F4322 Adjustment disorder with anxiety: Secondary | ICD-10-CM | POA: Diagnosis not present

## 2022-11-02 DIAGNOSIS — F4321 Adjustment disorder with depressed mood: Secondary | ICD-10-CM | POA: Diagnosis not present

## 2022-11-11 ENCOUNTER — Encounter: Payer: Self-pay | Admitting: Family Medicine

## 2022-11-11 ENCOUNTER — Ambulatory Visit: Payer: BC Managed Care – PPO | Admitting: Family Medicine

## 2022-11-11 VITALS — BP 90/60 | HR 82 | Temp 98.2°F | Ht 64.5 in | Wt 164.9 lb

## 2022-11-11 DIAGNOSIS — F900 Attention-deficit hyperactivity disorder, predominantly inattentive type: Secondary | ICD-10-CM | POA: Diagnosis not present

## 2022-11-11 DIAGNOSIS — R251 Tremor, unspecified: Secondary | ICD-10-CM

## 2022-11-11 DIAGNOSIS — E559 Vitamin D deficiency, unspecified: Secondary | ICD-10-CM

## 2022-11-11 DIAGNOSIS — D509 Iron deficiency anemia, unspecified: Secondary | ICD-10-CM | POA: Insufficient documentation

## 2022-11-11 DIAGNOSIS — D508 Other iron deficiency anemias: Secondary | ICD-10-CM

## 2022-11-11 LAB — CBC WITH DIFFERENTIAL/PLATELET
Basophils Absolute: 0 10*3/uL (ref 0.0–0.1)
Basophils Relative: 0.9 % (ref 0.0–3.0)
Eosinophils Absolute: 0.2 10*3/uL (ref 0.0–0.7)
Eosinophils Relative: 3.6 % (ref 0.0–5.0)
HCT: 36.8 % (ref 36.0–46.0)
Hemoglobin: 12.3 g/dL (ref 12.0–15.0)
Lymphocytes Relative: 25 % (ref 12.0–46.0)
Lymphs Abs: 1.4 10*3/uL (ref 0.7–4.0)
MCHC: 33.5 g/dL (ref 30.0–36.0)
MCV: 86.9 fl (ref 78.0–100.0)
Monocytes Absolute: 0.5 10*3/uL (ref 0.1–1.0)
Monocytes Relative: 8.3 % (ref 3.0–12.0)
Neutro Abs: 3.6 10*3/uL (ref 1.4–7.7)
Neutrophils Relative %: 62.2 % (ref 43.0–77.0)
Platelets: 341 10*3/uL (ref 150.0–400.0)
RBC: 4.23 Mil/uL (ref 3.87–5.11)
RDW: 13.8 % (ref 11.5–15.5)
WBC: 5.7 10*3/uL (ref 4.0–10.5)

## 2022-11-11 LAB — VITAMIN D 25 HYDROXY (VIT D DEFICIENCY, FRACTURES): VITD: 33.14 ng/mL (ref 30.00–100.00)

## 2022-11-11 MED ORDER — AMPHETAMINE-DEXTROAMPHET ER 20 MG PO CP24: 20.0000 mg | ORAL_CAPSULE | ORAL | 0 refills | Status: DC

## 2022-11-11 NOTE — Assessment & Plan Note (Signed)
On adderall 10 mg daily. She was just diagnosed last year with this. We had a long discussion about premenopause and how there is deterioration of attention and focus associated with menopause. I will increase her adderall XR to 20 mg daily and I encouraged her to discuss her menopausal symptoms wit hthe GYN to see what he recommends. I will see her back in 3 months for medication refills and follow up

## 2022-11-11 NOTE — Assessment & Plan Note (Signed)
None seen today on exam, she takes inderal 10 mg but only on an as needed basis. Based on her history I do not think that it is related to a movement disorder, however given her young age I wll place a referral to neurology for further evaluation/recommendations.

## 2022-11-11 NOTE — Assessment & Plan Note (Signed)
We will need to repeat her CBC and iron panel, patient is taking her iron supplements QOD. Patient reports very heavy periods and this is likely the reason for her iron deficiency. She already has an appointment scheduled with GYN in March.

## 2022-11-11 NOTE — Progress Notes (Signed)
New Patient Office Visit  Subjective    Patient ID: Victoria Parker, female    DOB: 1975-11-12  Age: 47 y.o. MRN: [id number]  CC:  Chief Complaint  Patient presents with   Establish Care    HPI Victoria Parker presents to establish care Patient was being seen at [facility]. She is reporting a chronic history of fatigue, constant tiredness, states she feels like she can just fall asleep at any moment. Patient is reporting that when she tries to exercise she feels short of breath, like she is "going to die". States that she has been getting a lot of headaches more recently. For the past 2 weeks she has been feeling dizzy and nauseous. Pt reports that she has gained weight in the last six months, not coming off with diet or exercise. Pt reports she tries to fall asleep at night around 10:30-11. States it takes her an hour to fall asleep, states that she is having occasional nighttime awakenings due to anxiety. States that she occasionally takes tylenol and melatonin, nothing prescription.  Patient states that she was sent to the cardiologist and had an ECHO and she was told her heart was "fine". States the cardiologist did not think she needed a stress test since she exercises regularly.   Pt has a history of tremor -- pt states she has had for years, mostly in her hands, has had this for years. No balance or coordination problems, no sensory changes, no weakness, no history of seizures. Takes propranolol 10 mg as needed for the tremor. States that when she is anxious it gets worse. States that she feels that the tremor is getting worse.   Patient has a history of anemia-- she takes iron supplements every other day. I reviewed her labs from June 2023 and her Hb was 10.2, iron levels were low as well.   ADHD-- patient goes to [facility]- states she was diagnosed with this about a year ago, that's when she noticed her increasing inattention and distractability,  states she kind of thinks it's helping with her completing her work.  Outpatient Encounter Medications as of 11/11/2022  Medication Sig   amphetamine-dextroamphetamine (ADDERALL XR) 10 MG 24 hr capsule Take 10 mg by mouth daily.   ibuprofen (ADVIL) 200 MG tablet Take 4 tablets (800 mg total) by mouth every 8 (eight) hours as needed for moderate pain.   Multiple Vitamin (MULTIVITAMIN) tablet Take 1 tablet by mouth daily.   propranolol (INDERAL) 10 MG tablet Take 10 mg by mouth every morning.   No facility-administered encounter medications on file as of 11/11/2022.    Past Medical History:  Diagnosis Date   ADHD    Anemia    Benign essential tremor    Endometrial polyp    Tremor     Past Surgical History:  Procedure Laterality Date   CESAREAN SECTION  2004   DILATATION & CURETTAGE/HYSTEROSCOPY WITH MYOSURE N/A 09/11/2018   Procedure: DILATATION & CURETTAGE/HYSTEROSCOPY;  Surgeon: Molli Posey, MD;  Location: [facility];  Service: Gynecology;  Laterality: N/A;   LAPAROSCOPIC TUBAL LIGATION Bilateral 09/11/2018   Procedure: LAPAROSCOPIC TUBAL LIGATION with Filshie clips;  Surgeon: Molli Posey, MD;  Location: [facility];  Service: Gynecology;  Laterality: Bilateral;    Family History  Problem Relation Age of Onset   Hypertension Mother    Diabetes Father     Social History   Socioeconomic History   Marital status: Divorced  Spouse name: Not on file   Number of children: Not on file   Years of education: Not on file   Highest education level: Not on file  Occupational History   Not on file  Tobacco Use   Smoking status: Never   Smokeless tobacco: Never  Vaping Use   Vaping Use: Never used  Substance and Sexual Activity   Alcohol use: Never   Drug use: Never   Sexual activity: Yes    Birth control/protection: None  Other Topics Concern   Not on file  Social History Narrative   Not on file   Social Determinants of Health    Financial Resource Strain: Not on file  Food Insecurity: Not on file  Transportation Needs: Not on file  Physical Activity: Not on file  Stress: Not on file  Social Connections: Not on file  Intimate Partner Violence: Not on file    Review of Systems  All other systems reviewed and are negative.       Objective    BP 90/60 (BP Location: Left Arm, Patient Position: Sitting, Cuff Size: Large)   Pulse 82   Temp 98.2 F (36.8 C) (Oral)   Ht 5' 4.5" (1.638 m)   Wt 164 lb 14.4 oz (74.8 kg)   LMP 10/06/2022 (Exact Date)   SpO2 98%   BMI 27.87 kg/m   Physical Exam Vitals reviewed.  Constitutional:      Appearance: Normal appearance. She is well-groomed and normal weight.  Eyes:     Conjunctiva/sclera: Conjunctivae normal.  Neck:     Thyroid: No thyromegaly.  Cardiovascular:     Rate and Rhythm: Normal rate and regular rhythm.     Pulses: Normal pulses.     Heart sounds: S1 normal and S2 normal.  Pulmonary:     Effort: Pulmonary effort is normal.     Breath sounds: Normal breath sounds and air entry.  Abdominal:     General: Bowel sounds are normal.  Musculoskeletal:     Right lower leg: No edema.     Left lower leg: No edema.  Neurological:     Mental Status: She is alert and oriented to person, place, and time. Mental status is at baseline.     Gait: Gait is intact.  Psychiatric:        Mood and Affect: Mood and affect normal.        Speech: Speech normal.        Behavior: Behavior normal.        Judgment: Judgment normal.         Assessment & Plan:   Problem List Items Addressed This Visit       Unprioritized   Iron deficiency anemia - Primary (Chronic)    We will need to repeat her CBC and iron panel, patient is taking her iron supplements QOD. Patient reports very heavy periods and this is likely the reason for her iron deficiency. She already has an appointment scheduled with GYN in March.       Relevant Orders   CBC with  Differential/Platelets (Completed)   Iron, TIBC and Ferritin Panel   Vitamin D deficiency    History of, needs new vitamin D level      Relevant Orders   Vitamin D, 25-hydroxy (Completed)   Tremor (Chronic)    None seen today on exam, she takes inderal 10 mg but only on an as needed basis. Based on her history I do not think that it is  related to a movement disorder, however given her young age I wll place a referral to neurology for further evaluation/recommendations.       Relevant Orders   Ambulatory referral to Neurology   ADHD (attention deficit hyperactivity disorder), inattentive type (Chronic)    On adderall 10 mg daily. She was just diagnosed last year with this. We had a long discussion about premenopause and how there is deterioration of attention and focus associated with menopause. I will increase her adderall XR to 20 mg daily and I encouraged her to discuss her menopausal symptoms wit hthe GYN to see what he recommends. I will see her back in 3 months for medication refills and follow up       Relevant Medications   amphetamine-dextroamphetamine (ADDERALL XR) 20 MG 24 hr capsule    Return in about 3 months (around 02/09/2023) for medication refills.   Farrel Conners, MD

## 2022-11-11 NOTE — Assessment & Plan Note (Signed)
History of, needs new vitamin D level

## 2022-11-12 LAB — IRON,TIBC AND FERRITIN PANEL
%SAT: 25 % (calc) (ref 16–45)
Ferritin: 8 ng/mL — ABNORMAL LOW (ref 16–232)
Iron: 101 ug/dL (ref 40–190)
TIBC: 404 mcg/dL (calc) (ref 250–450)

## 2022-12-13 DIAGNOSIS — Z1151 Encounter for screening for human papillomavirus (HPV): Secondary | ICD-10-CM | POA: Diagnosis not present

## 2022-12-13 DIAGNOSIS — Z6828 Body mass index (BMI) 28.0-28.9, adult: Secondary | ICD-10-CM | POA: Diagnosis not present

## 2022-12-13 DIAGNOSIS — N920 Excessive and frequent menstruation with regular cycle: Secondary | ICD-10-CM | POA: Diagnosis not present

## 2022-12-13 DIAGNOSIS — R5383 Other fatigue: Secondary | ICD-10-CM | POA: Diagnosis not present

## 2022-12-13 DIAGNOSIS — Z124 Encounter for screening for malignant neoplasm of cervix: Secondary | ICD-10-CM | POA: Diagnosis not present

## 2022-12-13 DIAGNOSIS — Z1231 Encounter for screening mammogram for malignant neoplasm of breast: Secondary | ICD-10-CM | POA: Diagnosis not present

## 2022-12-13 DIAGNOSIS — Z113 Encounter for screening for infections with a predominantly sexual mode of transmission: Secondary | ICD-10-CM | POA: Diagnosis not present

## 2022-12-13 DIAGNOSIS — Z1159 Encounter for screening for other viral diseases: Secondary | ICD-10-CM | POA: Diagnosis not present

## 2022-12-13 DIAGNOSIS — Z01411 Encounter for gynecological examination (general) (routine) with abnormal findings: Secondary | ICD-10-CM | POA: Diagnosis not present

## 2022-12-13 DIAGNOSIS — Z114 Encounter for screening for human immunodeficiency virus [HIV]: Secondary | ICD-10-CM | POA: Diagnosis not present

## 2022-12-13 DIAGNOSIS — Z118 Encounter for screening for other infectious and parasitic diseases: Secondary | ICD-10-CM | POA: Diagnosis not present

## 2022-12-13 LAB — HM PAP SMEAR
HM Pap smear: NEGATIVE
HPV, high-risk: NEGATIVE

## 2022-12-13 LAB — HM MAMMOGRAPHY

## 2022-12-20 ENCOUNTER — Ambulatory Visit: Payer: BC Managed Care – PPO | Admitting: Neurology

## 2022-12-20 ENCOUNTER — Encounter: Payer: Self-pay | Admitting: Neurology

## 2022-12-20 VITALS — BP 92/56 | HR 66 | Ht 63.0 in | Wt 165.0 lb

## 2022-12-20 DIAGNOSIS — F439 Reaction to severe stress, unspecified: Secondary | ICD-10-CM | POA: Diagnosis not present

## 2022-12-20 DIAGNOSIS — F419 Anxiety disorder, unspecified: Secondary | ICD-10-CM

## 2022-12-20 DIAGNOSIS — R251 Tremor, unspecified: Secondary | ICD-10-CM | POA: Diagnosis not present

## 2022-12-20 NOTE — Patient Instructions (Signed)
You have a tremor of both hands, likely essential tremor, given your family history and your presentation.  I do not see any signs or symptoms of parkinson's like disease or what we call parkinsonism.  For your tremor, I would not recommend any new medications at this time.  You can maintain low-dose propranolol through your primary care.  For sleep, you can increase her melatonin to 10 mg if tolerated.  I would like for you to increase your water intake to 3-4 bottles per day on average.  We can see you back in this clinic as needed.    Please remember, that any kind of tremor may be exacerbated by anxiety, anger, nervousness, excitement, dehydration, sleep deprivation, thyroid dysfunction, by caffeine, and low blood sugar values or blood sugar fluctuations. Some medications can exacerbate tremors, this includes certain asthma or COPD medications and certain antidepressants.  I would recommend that you talk to your primary care about ways to address anxiety as this is still a trigger for your tremor.

## 2022-12-20 NOTE — Progress Notes (Signed)
Subjective:    Patient ID: Victoria Parker is a 47 y.o. female.  HPI    Star Age, MD, PhD [facility] [address] [place], [place] [address]  Dear Dr. Legrand Como,  I saw your patient, Victoria Parker, upon your kind request in my neurologic clinic today for initial consultation of her tremors.  The patient is unaccompanied today.  As you know, [name] is a 47 year old female with an underlying medical history of anemia, ADHD, vitamin D deficiency, and mildly overweight state, who reports a longstanding history of hand tremors.  She reports that she originally noticed the tremor when she was in college.  She does have a family history of tremors affecting her father who is now 30 and had a hand tremor since his 50s.  She also reports that she has a nephew who is 65 who has a tremor.  She has siblings with, none of them with tremors.  She has been on propranolol as needed.  I reviewed your office note from 11/11/2022.  She had blood work at that time including iron studies, vitamin D level and CBC.  I do not see a recent TSH.  She reports that she had additional blood work with her GYN and gets regular checkups.  Vitamin D was on the low end at 33.1.  Ferritin was quite low at 8.  She has been on vitamin D and iron supplementation.  She reports limiting her caffeine, has gone to decaf coffee and drinks only 1 cup/day.  She does not always hydrate well, estimates that she drinks about 1 or 2 bottles of water per day.  She does report that nervousness and anxiety are triggers and she is often nervous and anxious but has not addressed it with any medication, she has been in counseling.  She reports stress, including personal life stress.  She works in an Therapist, art for [employer].  She lives with her kids.  She has been on ADHD medicine for the past year, has incidentally noticed worsening of her tremors in the past year.  She  does not sleep well.  She goes to bed around 11 and rise time is between 7:30 AM and 8:30 AM.  She takes melatonin as needed, 5 mg strength.  She has previously seen someone for her tremors.  She had a brain MRI in [place], some 7 years ago, she reports that brain MRI was fine.  She had worried about Parkinson's disease and was told that she did not have Parkinson's disease.   Her Past Medical History Is Significant For: Past Medical History:  Diagnosis Date   ADHD    Anemia    Benign essential tremor    Endometrial polyp    Tremor     Her Past Surgical History Is Significant For: Past Surgical History:  Procedure Laterality Date   CESAREAN SECTION  2004   DILATATION & CURETTAGE/HYSTEROSCOPY WITH MYOSURE N/A 09/11/2018   Procedure: DILATATION & CURETTAGE/HYSTEROSCOPY;  Surgeon: Molli Posey, MD;  Location: [facility];  Service: Gynecology;  Laterality: N/A;   LAPAROSCOPIC TUBAL LIGATION Bilateral 09/11/2018   Procedure: LAPAROSCOPIC TUBAL LIGATION with Filshie clips;  Surgeon: Molli Posey, MD;  [facility];  Service: Gynecology;  Laterality: Bilateral;    Her Family History Is Significant For: Family History  Problem Relation Age of Onset   Hypertension Mother    Diabetes Father    Tremor Neg Hx  Parkinson's disease Neg Hx     Her Social History Is Significant For: Social History   Socioeconomic History   Marital status: Divorced    Spouse name: Not on file   Number of children: Not on file   Years of education: Not on file   Highest education level: Not on file  Occupational History   Not on file  Tobacco Use   Smoking status: Never   Smokeless tobacco: Never  Vaping Use   Vaping Use: Never used  Substance and Sexual Activity   Alcohol use: Never   Drug use: Never   Sexual activity: Yes    Birth control/protection: None  Other Topics Concern   Not on file  Social History Narrative   Not on file   Social  Determinants of Health   Financial Resource Strain: Not on file  Food Insecurity: Not on file  Transportation Needs: Not on file  Physical Activity: Not on file  Stress: Not on file  Social Connections: Not on file    Her Allergies Are:  Allergies  Allergen Reactions   Vicodin [Hydrocodone-Acetaminophen] Anaphylaxis  :   Her Current Medications Are:  Outpatient Encounter Medications as of 12/20/2022  Medication Sig   amphetamine-dextroamphetamine (ADDERALL XR) 20 MG 24 hr capsule Take 1 capsule (20 mg total) by mouth every morning.   ibuprofen (ADVIL) 200 MG tablet Take 4 tablets (800 mg total) by mouth every 8 (eight) hours as needed for moderate pain.   Multiple Vitamin (MULTIVITAMIN) tablet Take 1 tablet by mouth daily.   propranolol (INDERAL) 10 MG tablet Take 10 mg by mouth every morning.   No facility-administered encounter medications on file as of 12/20/2022.  :   Review of Systems:  Out of a complete 14 point review of systems, all are reviewed and negative with the exception of these symptoms as listed below:   Review of Systems  Neurological:        Pt here for tremors Pt states tremors in both hands Pt states if she has anxiety her whole body has tremors     Objective:  Neurological Exam  Physical Exam Physical Examination:   Vitals:   12/20/22 0811  BP: (!) 92/56  Pulse: 66    General Examination: The patient is a very pleasant 47 y.o. female in no acute distress. She appears well-developed and well-nourished and well groomed.   HEENT: Normocephalic, atraumatic, pupils are equal, round and reactive to light, extraocular tracking is good without limitation to gaze excursion or nystagmus noted. Hearing is grossly intact. Face is symmetric with normal facial animation. Speech is clear with no dysarthria noted. There is no hypophonia. There is no lip, neck/head, jaw or voice tremor. Neck is supple with full range of passive and active motion. There are no  carotid bruits on auscultation. Oropharynx exam reveals: mild mouth dryness, adequate dental hygiene, tongue protrudes centrally and palate elevates symmetrically.   Chest: Clear to auscultation without wheezing, rhonchi or crackles noted.  Heart: S1+S2+0, regular and normal without murmurs, rubs or gallops noted.   Abdomen: Soft, non-tender and non-distended.  Extremities: There is no pitting edema in the distal lower extremities bilaterally.   Skin: Warm and dry without trophic changes noted.   Musculoskeletal: exam reveals no obvious joint deformities.   Neurologically:  Mental status: The patient is awake, alert and oriented in all 4 spheres. Her immediate and remote memory, attention, language skills and fund of knowledge are appropriate. There is no evidence of aphasia,  agnosia, apraxia or anomia. Speech is clear with normal prosody and enunciation. Thought process is linear. Mood is normal and affect is normal.  Cranial nerves II - XII are as described above under HEENT exam.  Motor exam: Normal bulk, strength and tone is noted. There is no intention or resting tremor.  She has a mild bilateral upper extremity postural and slight action tremor.    On 12/20/2022: On Archimedes spiral drawing she has mild to moderate trembling with her left hand, minimal trembling with the right hand.  Handwriting is legible, minimally tremulous, not micrographic.     Fine motor skills and coordination: intact finger taps, hand movements and rapid alternating patting in both upper extremities, normal foot taps and foot agility in both lower extremities bilaterally.  Cerebellar testing: No dysmetria or intention tremor. There is no truncal or gait ataxia.  Normal finger-to-nose and normal heel-to-shin bilaterally.  Reflexes are 2+ throughout including ankles, toes are downgoing bilaterally. Sensory exam: intact to light touch in the upper and lower extremities.  Gait, station and balance: She stands  easily. No veering to one side is noted. No leaning to one side is noted. Posture is age-appropriate and stance is narrow based. Gait shows normal stride length and normal pace. No problems turning are noted.  No shuffling, preserved arm swing noted.  Assessment and Plan:  In summary, Edla Burnor is a very pleasant 47 y.o.-year old female with an underlying medical history of anemia, ADHD, vitamin D deficiency, and mildly overweight state, who presents for evaluation of her hand tremors of several years duration, essentially since her early 49s.  Examination shows a mild hand tremor, she has no evidence of parkinsonism and is reassured in that regard.  She has had some success with low-dose and as needed use of a beta-blocker, she can maintain this through your office and low-dose, I recommended no increase in her beta-blocker as she has a low blood pressure and pulse rate is already in the 60s.  She is advised about tremor triggers, she is advised to increase her water intake and stay better hydrated, she is encouraged to increase water to 3-4 bottles per day and continue to limit her caffeine.  Certain medications even stimulants can exacerbate tremors and there may be a correlation here as well.  She is advised to continue to address her anxiety, she may benefit from pharmacological treatment as well but reports that she has been reluctant thus far to add yet another medication.  She is advised to follow-up through your office as scheduled and also talk to you about ways to manage her anxiety and stress.  She can follow-up in this clinic on an as-needed basis.  She has an otherwise nonfocal exam and, again, is reassured.  I offered her a brain MRI as she was wondering but given that she had a previous brain MRI which was unremarkable per her report and the lack of any other focal findings on exam, the MRI brain would be for reassurance.  She decided to hold off.   I answered all her questions today and  she was in agreement with our plan.    Thank you very much for allowing me to participate in the care of this nice patient. If I can be of any further assistance to you please do not hesitate to call me at [phone].  Sincerely,   Star Age, MD, PhD

## 2023-01-11 ENCOUNTER — Telehealth: Payer: Self-pay | Admitting: Family Medicine

## 2023-01-11 DIAGNOSIS — F900 Attention-deficit hyperactivity disorder, predominantly inattentive type: Secondary | ICD-10-CM

## 2023-01-11 MED ORDER — AMPHETAMINE-DEXTROAMPHET ER 20 MG PO CP24: 20.0000 mg | ORAL_CAPSULE | ORAL | 0 refills | Status: DC

## 2023-01-11 NOTE — Telephone Encounter (Signed)
Prescription Request  01/11/2023  LOV: 11/11/2022  What is the name of the medication or equipment?  amphetamine-dextroamphetamine amphetamine-dextroamphetamine (ADDERALL XR) 20 MG 24 hr capsule  Pt states she only has 4 pills left and they are working very well and she would like to continue taking them.  Have you contacted your pharmacy to request a refill? Yes   Which pharmacy would you like this sent to?  [id number] [place], [place] - [address] [place] [address] Phone: [phone] Fax: [phone]    Patient notified that their request is being sent to the clinical staff for review and that they should receive a response within 2 business days.   Please advise at Mobile [phone] (mobile)

## 2023-01-26 DIAGNOSIS — N920 Excessive and frequent menstruation with regular cycle: Secondary | ICD-10-CM | POA: Diagnosis not present

## 2023-02-09 ENCOUNTER — Ambulatory Visit: Payer: BC Managed Care – PPO | Admitting: Family Medicine

## 2023-02-09 DIAGNOSIS — Z3202 Encounter for pregnancy test, result negative: Secondary | ICD-10-CM | POA: Diagnosis not present

## 2023-02-09 DIAGNOSIS — Z30430 Encounter for insertion of intrauterine contraceptive device: Secondary | ICD-10-CM | POA: Diagnosis not present

## 2023-02-09 DIAGNOSIS — Z124 Encounter for screening for malignant neoplasm of cervix: Secondary | ICD-10-CM | POA: Diagnosis not present

## 2023-02-10 ENCOUNTER — Ambulatory Visit: Payer: BC Managed Care – PPO | Admitting: Family Medicine

## 2023-02-10 ENCOUNTER — Encounter: Payer: Self-pay | Admitting: Family Medicine

## 2023-02-10 VITALS — BP 98/68 | HR 70 | Temp 98.8°F | Ht 63.0 in | Wt 163.5 lb

## 2023-02-10 DIAGNOSIS — F900 Attention-deficit hyperactivity disorder, predominantly inattentive type: Secondary | ICD-10-CM | POA: Diagnosis not present

## 2023-02-10 DIAGNOSIS — E041 Nontoxic single thyroid nodule: Secondary | ICD-10-CM

## 2023-02-10 DIAGNOSIS — F411 Generalized anxiety disorder: Secondary | ICD-10-CM

## 2023-02-10 DIAGNOSIS — R251 Tremor, unspecified: Secondary | ICD-10-CM

## 2023-02-10 DIAGNOSIS — E010 Iodine-deficiency related diffuse (endemic) goiter: Secondary | ICD-10-CM

## 2023-02-10 MED ORDER — BUSPIRONE HCL 7.5 MG PO TABS: 7.5000 mg | ORAL_TABLET | Freq: Two times a day (BID) | ORAL | 2 refills | Status: DC

## 2023-02-10 MED ORDER — AMPHETAMINE-DEXTROAMPHET ER 20 MG PO CP24: 20.0000 mg | ORAL_CAPSULE | ORAL | 0 refills | Status: DC

## 2023-02-10 MED ORDER — PROPRANOLOL HCL 10 MG PO TABS: 10.0000 mg | ORAL_TABLET | Freq: Every morning | ORAL | 1 refills | Status: DC

## 2023-02-10 MED ORDER — AMPHETAMINE-DEXTROAMPHET ER 20 MG PO CP24: 20.0000 mg | ORAL_CAPSULE | Freq: Every day | ORAL | 0 refills | Status: DC

## 2023-02-10 NOTE — Progress Notes (Signed)
Established Patient Office Visit  Subjective   Patient ID: Victoria Parker, female    DOB: 04-20-1976  Age: 47 y.o. MRN: [id number]  Chief Complaint  Patient presents with   Medical Management of Chronic Issues    Patient is here for follow up today for her ADHD medication refills. States that the adderall is helping, however she feels like she continues to have anxiety as well. PHQ was reviewed. She was evaluated by the neurologist and placed on inderal, thinks that it might be helping with the tremor, is not taking it daily, just using as needed. We had a long discussion about her anxiety levels and medications that can help with this. Pt reports she is hesitant to start something that needs to be taken daily.   Patient states that she did see her GYN and had an IUD placed yesterday. States that she is a little crampy but otherwise is feeling fine.    Flowsheet Row Office Visit from 02/10/2023 in [facility]  PHQ-9 Total Score 13       Current Outpatient Medications  Medication Instructions   amphetamine-dextroamphetamine (ADDERALL XR) 20 MG 24 hr capsule 20 mg, Oral, BH-each morning   [START ON 03/10/2023] amphetamine-dextroamphetamine (ADDERALL XR) 20 MG 24 hr capsule 20 mg, Oral, Daily   busPIRone (BUSPAR) 7.5 mg, Oral, 2 times daily   ibuprofen (ADVIL) 800 mg, Oral, Every 8 hours PRN   Multiple Vitamin (MULTIVITAMIN) tablet 1 tablet, Oral, Daily   propranolol (INDERAL) 10 mg, Oral, Every morning    Patient Active Problem List   Diagnosis Date Noted   Generalized anxiety disorder 02/11/2023   Thyromegaly 02/11/2023   Iron deficiency anemia 11/11/2022   Vitamin D deficiency 11/11/2022   Tremor 11/11/2022   ADHD (attention deficit hyperactivity disorder), 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     Objective:     BP 98/68 (BP Location: Left Arm,  Patient Position: Sitting, Cuff Size: Normal)   Pulse 70   Temp 98.8 F (37.1 C) (Oral)   Ht 5\' 3"  (1.6 m)   Wt 163 lb 8 oz (74.2 kg)   SpO2 99%   BMI 28.96 kg/m    Physical Exam Vitals reviewed.  Constitutional:      Appearance: Normal appearance. She is well-groomed and normal weight.  Eyes:     Conjunctiva/sclera: Conjunctivae normal.  Neck:     Thyroid: Thyromegaly (left >right) present.  Cardiovascular:     Rate and Rhythm: Normal rate and regular rhythm.     Pulses: Normal pulses.     Heart sounds: S1 normal and S2 normal.  Pulmonary:     Effort: Pulmonary effort is normal.     Breath sounds: Normal breath sounds and air entry.  Abdominal:     General: Bowel sounds are normal.  Neurological:     Mental Status: She is alert and oriented to person, place, and time. Mental status is at baseline.     Gait: Gait is intact.  Psychiatric:        Mood and Affect: Mood and affect normal.        Speech: Speech normal.        Behavior: Behavior normal.        Judgment: Judgment normal.      The ASCVD Risk score (Arnett DK, et al., 2019) failed to calculate for the following reasons:  Cannot find a previous HDL lab    Assessment & Plan:  Tremor Assessment & Plan: S/p evaluation by neurology, continue inderal 10 mg daily  Orders: -     Propranolol HCl; Take 1 tablet (10 mg total) by mouth every morning.  Dispense: 90 tablet; Refill: 1  ADHD (attention deficit hyperactivity disorder), inattentive type Assessment & Plan: Sx are controlled, will continue current meds, see below  Orders: -     Amphetamine-Dextroamphet ER; Take 1 capsule (20 mg total) by mouth every morning.  Dispense: 30 capsule; Refill: 0 -     Amphetamine-Dextroamphet ER; Take 1 capsule (20 mg total) by mouth daily.  Dispense: 30 capsule; Refill: 0  Generalized anxiety disorder Assessment & Plan: We discussed medication, will start a low dose buspirone 7.5 mg BID as needed. Follow up in 3  months  Orders: -     busPIRone HCl; Take 1 tablet (7.5 mg total) by mouth 2 (two) times daily.  Dispense: 60 tablet; Refill: 2 -     TSH  Thyromegaly Assessment & Plan: Left lob felt enlarged on today's exam, will check TSH and send for thyroid US  Orders: -     US THYROID; Future     Return in about 3 months (around 05/13/2023) for medication refills-- ok to do video visit.    Karie Georges, MD

## 2023-02-10 NOTE — Patient Instructions (Signed)
Melatonin 5 mg -10 mg at bedtime

## 2023-02-11 ENCOUNTER — Ambulatory Visit (HOSPITAL_BASED_OUTPATIENT_CLINIC_OR_DEPARTMENT_OTHER)
Admission: RE | Admit: 2023-02-11 | Discharge: 2023-02-11 | Disposition: A | Discharge status: Home / Self Care | Payer: BC Managed Care – PPO | Source: Ambulatory Visit | Attending: Family Medicine | Admitting: Family Medicine

## 2023-02-11 DIAGNOSIS — F411 Generalized anxiety disorder: Secondary | ICD-10-CM | POA: Insufficient documentation

## 2023-02-11 DIAGNOSIS — E010 Iodine-deficiency related diffuse (endemic) goiter: Secondary | ICD-10-CM | POA: Insufficient documentation

## 2023-02-11 DIAGNOSIS — E042 Nontoxic multinodular goiter: Secondary | ICD-10-CM | POA: Diagnosis not present

## 2023-02-11 LAB — TSH: TSH: 2.98 u[IU]/mL (ref 0.35–5.50)

## 2023-02-11 NOTE — Assessment & Plan Note (Signed)
Sx are controlled, will continue current meds, see below

## 2023-02-11 NOTE — Assessment & Plan Note (Signed)
Left lob felt enlarged on today's exam, will check TSH and send for thyroid US

## 2023-02-11 NOTE — Assessment & Plan Note (Signed)
S/p evaluation by neurology, continue inderal 10 mg daily

## 2023-02-11 NOTE — Assessment & Plan Note (Signed)
We discussed medication, will start a low dose buspirone 7.5 mg BID as needed. Follow up in 3 months

## 2023-02-14 NOTE — Addendum Note (Signed)
Addended by: Karie Georges on: 02/14/2023 08:17 AM   Modules accepted: Orders

## 2023-02-23 ENCOUNTER — Telehealth: Payer: Self-pay | Admitting: Family Medicine

## 2023-02-23 NOTE — Telephone Encounter (Addendum)
Pt called to inform MD that she still has not heard from the Provider she was referred to for a Biopsy. Pt states MD told her to let her know, if she has not heard from them after a week. Pt would like some advice on what to do next, if anything?

## 2023-03-02 NOTE — Telephone Encounter (Signed)
Patient informed of the message below.  Patient stated she talked with someone at the specialty office and was told it will be sent to the provider for review.

## 2023-03-08 ENCOUNTER — Other Ambulatory Visit: Payer: Self-pay | Admitting: Family Medicine

## 2023-03-08 DIAGNOSIS — F411 Generalized anxiety disorder: Secondary | ICD-10-CM

## 2023-03-10 DIAGNOSIS — Z30431 Encounter for routine checking of intrauterine contraceptive device: Secondary | ICD-10-CM | POA: Diagnosis not present

## 2023-04-08 ENCOUNTER — Other Ambulatory Visit: Payer: Self-pay

## 2023-04-08 DIAGNOSIS — E041 Nontoxic single thyroid nodule: Secondary | ICD-10-CM

## 2023-04-22 ENCOUNTER — Other Ambulatory Visit: Payer: BC Managed Care – PPO

## 2023-04-25 ENCOUNTER — Other Ambulatory Visit (INDEPENDENT_AMBULATORY_CARE_PROVIDER_SITE_OTHER): Payer: BC Managed Care – PPO

## 2023-04-25 DIAGNOSIS — E041 Nontoxic single thyroid nodule: Secondary | ICD-10-CM | POA: Diagnosis not present

## 2023-04-26 LAB — T4, FREE: Free T4: 0.86 ng/dL (ref 0.60–1.60)

## 2023-04-26 LAB — TSH: TSH: 2.22 u[IU]/mL (ref 0.35–5.50)

## 2023-04-28 ENCOUNTER — Ambulatory Visit: Payer: BC Managed Care – PPO | Admitting: "Endocrinology

## 2023-04-28 ENCOUNTER — Encounter: Payer: Self-pay | Admitting: "Endocrinology

## 2023-04-28 VITALS — BP 115/70 | HR 60 | Ht 63.0 in | Wt 164.0 lb

## 2023-04-28 DIAGNOSIS — E042 Nontoxic multinodular goiter: Secondary | ICD-10-CM

## 2023-04-28 NOTE — Progress Notes (Signed)
Outpatient Endocrinology Note Altamese Altoona, MD  04/28/23   Victoria Parker 1976-06-21 [id number]  Referring Provider: Karie Georges, MD Primary Care Provider: Karie Georges, MD Subjective  No chief complaint on file.   Assessment & Plan  Diagnoses and all orders for this visit:  Multinodular goiter -     [place] FNA BX THYROID 1ST LESION AFIRMA; Future -     [place] FNA BX THYROID 1ST LESION AFIRMA    Victoria Parker is currently not taking any thyroid medication. Patient is currently biochemically euthyroid.  Educated on thyroid axis.  No thyroid medication indicated.   02/11/23 thyroid ultrasound multinodular goiter. Nodule # 2 Location: Left; Mid 3.4 cm; Other 2 dimensions: 2.52.4 cm Dominant left-sided thyroid nodule (labeled 2), potentially correlating with the palpable area of concern, meets imaging criteria to recommend percutaneous sampling as indicated. Neither of the additional punctate (sub 0.8 cm) thyroid nodules meet imaging criteria to recommend percutaneous sampling or continued dedicated follow-up.  I have reviewed current medications, nurse's notes, allergies, vital signs, past medical and surgical history, family medical history, and social history for this encounter. Counseled patient on symptoms, examination findings, lab findings, imaging results, treatment decisions and monitoring and prognosis. The patient understood the recommendations and agrees with the treatment plan. All questions regarding treatment plan were fully answered.   Return in about 1 year (around 04/27/2024).   Altamese Tet[place], MD  04/28/23   I have reviewed current medications, nurse's notes, allergies, vital signs, past medical and surgical history, family medical history, and social history for this encounter. Counseled patient on symptoms, examination findings, lab findings, imaging results, treatment decisions and monitoring and prognosis. The patient understood the  recommendations and agrees with the treatment plan. All questions regarding treatment plan were fully answered.   History of Present Illness Victoria Parker is a 47 y.o. year old female who presents to our clinic with multinodular goiter.    Patient reports history of thyroid nodule around/earlier than 2018. Reports having a FNA done back in [place] around 2018 that was "benign" per pt.  Patient c/o anxiety and associated tremor and palpitation. Patient is getting treatment for it.  Symptoms suggestive of HYPOTHYROIDISM:  fatigue Yes weight gain No cold intolerance  Yes constipation  Yes  Symptoms suggestive of HYPERTHYROIDISM:  weight loss  No heat intolerance No hyperdefecation  No palpitations  Yes  Compressive symptoms:  dysphagia  No dysphonia  No positional dyspnea (especially with simultaneous arms elevation)  No  Smokes  No On biotin  No Personal history of head/neck surgery/irradiation  No  02/12/23 THYROID ULTRASOUND   TECHNIQUE: Ultrasound examination of the thyroid gland and adjacent soft tissues was performed.   COMPARISON:  None Available.   FINDINGS: Parenchymal Echotexture: Normal   Isthmus: Normal in size measuring 0.2 cm in diameter   Right lobe: Normal in size measuring 4.1 x 1.9 x 1.7 cm   Left lobe: Enlarged measuring 5.2 x 2.7 x 2.5 cm   _________________________________________________________   Estimated total number of nodules >/= 1 cm: 1   Number of spongiform nodules >/=  2 cm not described below (TR1): 0   Number of mixed cystic and solid nodules >/= 1.5 cm not described below (TR2): 0   _________________________________________________________   There is a punctate (approximate 0.8 cm) isoechoic nodule within the superior pole of the right lobe of the thyroid (labeled 1), which does not meet criteria to recommend percutaneous sampling or continued dedicated follow-up.  _________________________________________________________    Nodule # 2:   Location: Left; Mid   Maximum size: 3.4 cm; Other 2 dimensions: 2.52.4 cm   Composition: solid/almost completely solid (2)   Echogenicity: isoechoic (1)   Shape: not taller-than-wide (0)   Margins: smooth (0)   Echogenic foci: none (0)   ACR TI-RADS total points: 3.   ACR TI-RADS risk category: TR3 (3 points).   ACR TI-RADS recommendations:   **Given size (>/= 2.5 cm) and appearance, fine needle aspiration of this mildly suspicious nodule should be considered based on TI-RADS criteria.   _________________________________________________________   There is a punctate (approximately 0.6 cm) isoechoic nodule within the superior pole of the left lobe of the thyroid (labeled 3), which does not meet criteria to recommend percutaneous sampling or continued dedicated follow-up.   IMPRESSION: 1. Findings suggestive of multinodular goiter. 2. Dominant left-sided thyroid nodule (labeled 2), potentially correlating with the palpable area of concern, meets imaging criteria to recommend percutaneous sampling as indicated. 3. Neither of the additional punctate (sub 0.8 cm) thyroid nodules meet imaging criteria to recommend percutaneous sampling or continued dedicated follow-up.   Physical Exam  BP 115/70   Pulse 60   Ht 5\' 3"  (1.6 m)   Wt 164 lb (74.4 kg)   SpO2 99%   BMI 29.05 kg/m  Constitutional: well developed, well nourished Head: normocephalic, atraumatic, no exophthalmos Eyes: sclera anicteric, no redness Neck: no thyromegaly, no thyroid tenderness; + nodules palpated Lungs: normal respiratory effort Neurology: alert and oriented, + fine hand tremor Skin: dry, no appreciable rashes Musculoskeletal: no appreciable defects Psychiatric: normal mood and affect  Allergies Allergies  Allergen Reactions   Vicodin [Hydrocodone-Acetaminophen] Anaphylaxis    Current Medications Patient's Medications  New Prescriptions   No medications on file   Previous Medications   AMPHETAMINE-DEXTROAMPHETAMINE (ADDERALL XR) 20 MG 24 HR CAPSULE    Take 1 capsule (20 mg total) by mouth every morning.   BUSPIRONE (BUSPAR) 7.5 MG TABLET    TAKE 1 TABLET BY MOUTH 2 TIMES DAILY.   IBUPROFEN (ADVIL) 200 MG TABLET    Take 4 tablets (800 mg total) by mouth every 8 (eight) hours as needed for moderate pain.   MULTIPLE VITAMIN (MULTIVITAMIN) TABLET    Take 1 tablet by mouth daily.   PROPRANOLOL (INDERAL) 10 MG TABLET    Take 1 tablet (10 mg total) by mouth every morning.  Modified Medications   No medications on file  Discontinued Medications   AMPHETAMINE-DEXTROAMPHETAMINE (ADDERALL XR) 20 MG 24 HR CAPSULE    Take 1 capsule (20 mg total) by mouth daily.    Past Medical History Past Medical History:  Diagnosis Date   ADHD    Anemia    Benign essential tremor    Endometrial polyp    Tremor     Past Surgical History Past Surgical History:  Procedure Laterality Date   CESAREAN SECTION  2004   DILATATION & CURETTAGE/HYSTEROSCOPY WITH MYOSURE N/A 09/11/2018   Procedure: DILATATION & CURETTAGE/HYSTEROSCOPY;  Surgeon: Richarda Overlie, MD;  Location[facility];  Service: Gynecology;  Laterality: N/A;   LAPAROSCOPIC TUBAL LIGATION Bilateral 09/11/2018   Procedure: LAPAROSCOPIC TUBAL LIGATION with Filshie clips;  Surgeon: Richarda Overlie, MD;  Location[facility];  Service: Gynecology;  Laterality: Bilateral;    Family History family history includes Diabetes in her father; Hypertension in her mother.  Social History Social History   Socioeconomic History   Marital status: Divorced    Spouse name: Not  on file   Number of children: Not on file   Years of education: Not on file   Highest education level: Not on file  Occupational History   Not on file  Tobacco Use   Smoking status: Never   Smokeless tobacco: Never  Vaping Use   Vaping status: Never Used  Substance and Sexual Activity   Alcohol use: Never    Drug use: Never   Sexual activity: Yes    Birth control/protection: None  Other Topics Concern   Not on file  Social History Narrative   Not on file   Social Determinants of Health   Financial Resource Strain: Not on file  Food Insecurity: No Food Insecurity (03/11/2022)   Received from [facility]   Hunger Vital Sign    Worried About Running Out of Food in the Last Year: Never true    Ran Out of Food in the Last Year: Never true  Transportation Needs: Not on file  Physical Activity: Not on file  Stress: Not on file  Social Connections: Unknown (03/05/2022)   Received from [employer]   Social Network    Social Network: Not on file  Intimate Partner Violence: Unknown (03/05/2022)   Received from [facility]   HITS    Physically Hurt: Not on file    Insult or Talk Down To: Not on file    Threaten Physical Harm: Not on file    Scream or Curse: Not on file    Laboratory Investigations Lab Results  Component Value Date   TSH 2.22 04/25/2023   TSH 2.98 02/10/2023   FREET4 0.86 04/25/2023     No results found for: "TSI"   No components found for: "TRAB"   No results found for: "CHOL" No results found for: "HDL" No results found for: "LDLCALC" No results found for: "TRIG" No results found for: "CHOLHDL" No results found for: "CREATININE" No results found for: "GFR" No results found for: "NA", "K", "CL", "CO2", "GLUCOSE", "BUN", "CREATININE", "CALCIUM", "PROT", "ALBUMIN", "AST", "ALT", "ALKPHOS", "BILITOT", "GFRNONAA", "GFRAA"     No data to display             Component Value Date/Time   WBC 5.7 11/11/2022 0908   RBC 4.23 11/11/2022 0908   HGB 12.3 11/11/2022 0908   HCT 36.8 11/11/2022 0908   PLT 341.0 11/11/2022 0908   MCV 86.9 11/11/2022 0908   MCH 26.9 09/11/2018 0548   MCHC 33.5 11/11/2022 0908   RDW 13.8 11/11/2022 0908   LYMPHSABS 1.4 11/11/2022 0908   MONOABS 0.5 11/11/2022 0908   EOSABS 0.2 11/11/2022 0908   BASOSABS 0.0 11/11/2022 0908       Parts of this note may have been dictated using voice recognition software. There may be variances in spelling and vocabulary which are unintentional. Not all errors are proofread. Please notify the Thereasa Parkin if any discrepancies are noted or if the meaning of any statement is not clear.

## 2023-05-02 ENCOUNTER — Encounter: Payer: Self-pay | Admitting: Family Medicine

## 2023-05-02 DIAGNOSIS — F900 Attention-deficit hyperactivity disorder, predominantly inattentive type: Secondary | ICD-10-CM

## 2023-05-03 MED ORDER — LISDEXAMFETAMINE DIMESYLATE 30 MG PO CAPS: 30.0000 mg | ORAL_CAPSULE | Freq: Every day | ORAL | 0 refills | Status: DC

## 2023-05-12 ENCOUNTER — Ambulatory Visit
Admission: RE | Admit: 2023-05-12 | Discharge: 2023-05-12 | Disposition: A | Discharge status: Home / Self Care | Payer: BC Managed Care – PPO | Source: Ambulatory Visit | Attending: "Endocrinology | Admitting: "Endocrinology

## 2023-05-12 DIAGNOSIS — E0789 Other specified disorders of thyroid: Secondary | ICD-10-CM | POA: Diagnosis not present

## 2023-05-12 DIAGNOSIS — E041 Nontoxic single thyroid nodule: Secondary | ICD-10-CM | POA: Diagnosis not present

## 2023-05-12 DIAGNOSIS — E042 Nontoxic multinodular goiter: Secondary | ICD-10-CM

## 2023-05-13 ENCOUNTER — Other Ambulatory Visit (HOSPITAL_COMMUNITY)
Admission: RE | Admit: 2023-05-13 | Discharge: 2023-05-13 | Disposition: A | Discharge status: Home / Self Care | Payer: BC Managed Care – PPO | Source: Ambulatory Visit | Attending: "Endocrinology | Admitting: "Endocrinology

## 2023-05-13 ENCOUNTER — Encounter: Payer: Self-pay | Admitting: Family Medicine

## 2023-05-13 ENCOUNTER — Ambulatory Visit: Payer: BC Managed Care – PPO | Admitting: Family Medicine

## 2023-05-13 VITALS — BP 98/68 | HR 67 | Temp 98.8°F | Ht 63.0 in | Wt 163.3 lb

## 2023-05-13 DIAGNOSIS — F411 Generalized anxiety disorder: Secondary | ICD-10-CM | POA: Diagnosis not present

## 2023-05-13 DIAGNOSIS — E042 Nontoxic multinodular goiter: Secondary | ICD-10-CM | POA: Diagnosis not present

## 2023-05-13 DIAGNOSIS — R251 Tremor, unspecified: Secondary | ICD-10-CM | POA: Diagnosis not present

## 2023-05-13 DIAGNOSIS — Z1211 Encounter for screening for malignant neoplasm of colon: Secondary | ICD-10-CM

## 2023-05-13 DIAGNOSIS — F900 Attention-deficit hyperactivity disorder, predominantly inattentive type: Secondary | ICD-10-CM

## 2023-05-13 MED ORDER — LISDEXAMFETAMINE DIMESYLATE 30 MG PO CAPS: 30.0000 mg | ORAL_CAPSULE | Freq: Every day | ORAL | 0 refills | Status: DC

## 2023-05-13 MED ORDER — PROPRANOLOL HCL 10 MG PO TABS: 10.0000 mg | ORAL_TABLET | Freq: Every morning | ORAL | 1 refills | Status: AC

## 2023-05-13 MED ORDER — ESCITALOPRAM OXALATE 5 MG PO TABS: 5.0000 mg | ORAL_TABLET | Freq: Every day | ORAL | 0 refills | Status: DC

## 2023-05-13 NOTE — Progress Notes (Signed)
Established Patient Office Visit  Subjective   Patient ID: Victoria Parker, female    DOB: [birth year]  Age: 47 y.o. MRN: [id number]  Chief Complaint  Patient presents with   Medical Management of Chronic Issues    Patient is here for follow up today. She did see the endocrinologist and she had her thyroid biopsy yesterday and does not have the results yet. States that the endocrinologist told her that it was not likely to be cancerous.   ADHD-- patient states she likes the vyvanse better than the adderall. States it is working very well for her. States that she is not having any side effects to the medication.   GAD - patient stopped taking the buspirone, states that it was making her "feel funny", states that she has been going through a lot of family stress recently.   Hair loss-- pt reports she has had increased hair loss over the last few months. States she recently went to her GYN and had an IUD placed but now she is having light bleeding almost every day. We discussed this and I counseled patient that it could take up to 6 months for the bleeding to improve.     Current Outpatient Medications  Medication Instructions   escitalopram (LEXAPRO) 5 mg, Oral, Daily   ibuprofen (ADVIL) 800 mg, Oral, Every 8 hours PRN   [START ON 07/29/2023] lisdexamfetamine (VYVANSE) 30 mg, Oral, Daily   [START ON 07/01/2023] lisdexamfetamine (VYVANSE) 30 mg, Oral, Daily   [START ON 06/02/2023] lisdexamfetamine (VYVANSE) 30 mg, Oral, Daily   Multiple Vitamin (MULTIVITAMIN) tablet 1 tablet, Oral, Daily   propranolol (INDERAL) 10 mg, Oral, Every morning    Patient Active Problem List   Diagnosis Date Noted   Generalized anxiety disorder 02/11/2023   Thyromegaly 02/11/2023   Iron deficiency anemia 11/11/2022   Vitamin D deficiency 11/11/2022   Tremor 11/11/2022   ADHD (attention deficit hyperactivity disorder), 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     Objective:     BP 98/68 (BP Location: Left Arm, Patient Position: Sitting, Cuff Size: Normal)   Pulse 67   Temp 98.8 F (37.1 C) (Oral)   Ht 5\' 3"  (1.6 m)   Wt 163 lb 4.8 oz (74.1 kg)   LMP 05/06/2023 (Exact Date)   SpO2 99%   BMI 28.93 kg/m     Physical Exam Vitals reviewed.  Constitutional:      Appearance: Normal appearance. She is normal weight.  Eyes:     Conjunctiva/sclera: Conjunctivae normal.  Cardiovascular:     Rate and Rhythm: Normal rate and regular rhythm.     Pulses: Normal pulses.     Heart sounds: Normal heart sounds. No murmur heard. Pulmonary:     Effort: Pulmonary effort is normal.     Breath sounds: Normal breath sounds. No wheezing or rhonchi.  Neurological:     General: No focal deficit present.     Mental Status: She is alert and oriented to person, place, and time. Mental status is at baseline.  Psychiatric:        Mood and Affect: Mood normal.        Behavior: Behavior normal.      No results found for any visits on 05/13/23.     The ASCVD Risk score (Arnett DK, et al., 2019) failed to calculate for the following reasons:   Cannot find a previous  HDL lab    Assessment & Plan:  ADHD (attention deficit hyperactivity disorder), inattentive type Assessment & Plan: Doing well on the vyvanse, will continue this for her. Script written  Orders: -     Lisdexamfetamine Dimesylate; Take 1 capsule (30 mg total) by mouth daily.  Dispense: 30 capsule; Refill: 0 -     Lisdexamfetamine Dimesylate; Take 1 capsule (30 mg total) by mouth daily.  Dispense: 30 capsule; Refill: 0 -     Lisdexamfetamine Dimesylate; Take 1 capsule (30 mg total) by mouth daily.  Dispense: 30 capsule; Refill: 0  Tremor Assessment & Plan: Well controlled with propranolo, refills sent  Orders: -     Propranolol HCl; Take 1 tablet (10 mg total) by mouth every morning.  Dispense: 90 tablet; Refill: 1  Generalized anxiety  disorder Assessment & Plan: We had a discussion about medication, patient is willing to try lexapro 5 mg daily, will discontinue the buspirone. RTC in 3 months for follow up.   Orders: -     Escitalopram Oxalate; Take 1 tablet (5 mg total) by mouth daily.  Dispense: 90 tablet; Refill: 0  Colon cancer screening -     Ambulatory referral to Gastroenterology     Return in about 3 months (around 08/13/2023) for follow up anxiety and med refills.    Karie Georges, MD

## 2023-05-13 NOTE — Patient Instructions (Signed)
Pumpkin seed oil Saw palmetto

## 2023-05-13 NOTE — Assessment & Plan Note (Signed)
Well controlled with propranolo, refills sent

## 2023-05-13 NOTE — Assessment & Plan Note (Signed)
Doing well on the vyvanse, will continue this for her. Script written

## 2023-05-13 NOTE — Assessment & Plan Note (Signed)
We had a discussion about medication, patient is willing to try lexapro 5 mg daily, will discontinue the buspirone. RTC in 3 months for follow up.

## 2023-06-29 ENCOUNTER — Ambulatory Visit (AMBULATORY_SURGERY_CENTER): Payer: BC Managed Care – PPO | Admitting: *Deleted

## 2023-06-29 VITALS — Ht 64.0 in | Wt 166.0 lb

## 2023-06-29 DIAGNOSIS — Z1211 Encounter for screening for malignant neoplasm of colon: Secondary | ICD-10-CM

## 2023-06-29 MED ORDER — NA SULFATE-K SULFATE-MG SULF 17.5-3.13-1.6 GM/177ML PO SOLN
1.0000 | Freq: Once | ORAL | 0 refills | Status: AC
Start: 2023-06-29 — End: 2023-06-29

## 2023-06-29 NOTE — Progress Notes (Signed)

## 2023-07-06 ENCOUNTER — Encounter: Payer: Self-pay | Admitting: Internal Medicine

## 2023-07-10 ENCOUNTER — Encounter: Payer: Self-pay | Admitting: Certified Registered Nurse Anesthetist

## 2023-07-15 ENCOUNTER — Ambulatory Visit: Payer: BC Managed Care – PPO | Admitting: Internal Medicine

## 2023-07-15 ENCOUNTER — Encounter: Payer: Self-pay | Admitting: Internal Medicine

## 2023-07-15 VITALS — BP 92/53 | HR 87 | Temp 97.8°F | Resp 14 | Ht 64.0 in | Wt 166.0 lb

## 2023-07-15 DIAGNOSIS — Z1211 Encounter for screening for malignant neoplasm of colon: Secondary | ICD-10-CM | POA: Diagnosis not present

## 2023-07-15 DIAGNOSIS — D125 Benign neoplasm of sigmoid colon: Secondary | ICD-10-CM

## 2023-07-15 DIAGNOSIS — K635 Polyp of colon: Secondary | ICD-10-CM | POA: Diagnosis not present

## 2023-07-15 MED ORDER — SODIUM CHLORIDE 0.9 % IV SOLN: 500.0000 mL | INTRAVENOUS | Status: DC

## 2023-07-15 NOTE — Progress Notes (Signed)
Pt's states no medical or surgical changes since previsit or office visit. 

## 2023-07-15 NOTE — Progress Notes (Signed)
Report given to PACU, vss 

## 2023-07-15 NOTE — Progress Notes (Signed)
GASTROENTEROLOGY PROCEDURE H&P NOTE   Primary Care Physician: Karie Georges, MD    Reason for Procedure:   Colon cancer screening  Plan:    Colonoscopy  Patient is appropriate for endoscopic procedure(s) in the ambulatory ([facility]) setting.  The nature of the procedure, as well as the risks, benefits, and alternatives were carefully and thoroughly reviewed with the patient. Ample time for discussion and questions allowed. The patient understood, was satisfied, and agreed to proceed.     HPI: Victoria Parker is a 47 y.o. female who presents for colonoscopy for colon cancer screening. Denies blood in stools, changes in bowel habits, or unintentional weight loss. Denies family history of colon cancer.  Past Medical History:  Diagnosis Date   ADHD    Anemia    Anxiety    Benign essential tremor    Depression    Endometrial polyp    Tremor     Past Surgical History:  Procedure Laterality Date   CESAREAN SECTION  2004   DILATATION & CURETTAGE/HYSTEROSCOPY WITH MYOSURE N/A 09/11/2018   Procedure: DILATATION & CURETTAGE/HYSTEROSCOPY;  Surgeon: Richarda Overlie, MD;  Location: [facility];  Service: Gynecology;  Laterality: N/A;   LAPAROSCOPIC TUBAL LIGATION Bilateral 09/11/2018   Procedure: LAPAROSCOPIC TUBAL LIGATION with Filshie clips;  Surgeon: Richarda Overlie, MD;  Location: [facility];  Service: Gynecology;  Laterality: Bilateral;    Prior to Admission medications   Medication Sig Start Date End Date Taking? Authorizing Provider  Cholecalciferol 1.25 MG (50000 UT) capsule Take 1 capsule by mouth once a week. 03/15/22  Yes [provider]  escitalopram (LEXAPRO) 5 MG tablet Take 1 tablet (5 mg total) by mouth daily. 05/13/23  Yes Karie Georges, MD  ferrous gluconate (FERGON) 324 MG tablet Take by mouth. 03/15/22  Yes [provider]  folic acid (FOLVITE) 1 MG tablet Take 1 mg by mouth daily. 02/22/23  Yes [provider]  ibuprofen (ADVIL) 200 MG tablet Take 4 tablets (800 mg total) by mouth every 8 (eight) hours as needed for moderate pain. 09/11/18  Yes Richarda Overlie, MD  lisdexamfetamine (VYVANSE) 30 MG capsule Take 1 capsule (30 mg total) by mouth daily. 07/29/23  Yes Karie Georges, MD  lisdexamfetamine (VYVANSE) 30 MG capsule Take 1 capsule (30 mg total) by mouth daily. 07/01/23  Yes Karie Georges, MD  lisdexamfetamine (VYVANSE) 30 MG capsule Take 1 capsule (30 mg total) by mouth daily. 06/02/23  Yes Karie Georges, MD  Multiple Vitamin (MULTIVITAMIN) tablet Take 1 tablet by mouth daily.   Yes [provider]  Omega-3 Fatty Acids (FISH OIL) 1000 MG CAPS Take by mouth.   Yes [provider]  propranolol (INDERAL) 10 MG tablet Take 1 tablet (10 mg total) by mouth every morning. 05/13/23  Yes Karie Georges, MD    Current Outpatient Medications  Medication Sig Dispense Refill   Cholecalciferol 1.25 MG (50000 UT) capsule Take 1 capsule by mouth once a week.     escitalopram (LEXAPRO) 5 MG tablet Take 1 tablet (5 mg total) by mouth daily. 90 tablet 0   ferrous gluconate (FERGON) 324 MG tablet Take by mouth.     folic acid (FOLVITE) 1 MG tablet Take 1 mg by mouth daily.     ibuprofen (ADVIL) 200 MG tablet Take 4 tablets (800 mg total) by mouth every 8 (eight) hours as needed for moderate pain. 30 tablet 1   [START ON 07/29/2023] lisdexamfetamine (VYVANSE) 30  MG capsule Take 1 capsule (30 mg total) by mouth daily. 30 capsule 0   lisdexamfetamine (VYVANSE) 30 MG capsule Take 1 capsule (30 mg total) by mouth daily. 30 capsule 0   lisdexamfetamine (VYVANSE) 30 MG capsule Take 1 capsule (30 mg total) by mouth daily. 30 capsule 0   Multiple Vitamin (MULTIVITAMIN) tablet Take 1 tablet by mouth daily.     Omega-3 Fatty Acids (FISH OIL) 1000 MG CAPS Take by mouth.     propranolol (INDERAL) 10 MG tablet Take 1 tablet (10 mg total) by mouth every morning. 90 tablet 1    Current Facility-Administered Medications  Medication Dose Route Frequency Provider Last Rate Last Admin   0.9 %  sodium chloride infusion  500 mL Intravenous Continuous Imogene Burn, MD        Allergies as of 07/15/2023 - Review Complete 07/15/2023  Allergen Reaction Noted   Vicodin [hydrocodone-acetaminophen] Anaphylaxis 09/11/2018    Family History  Problem Relation Age of Onset   Hypertension Mother    Diabetes Father    Crohn's disease Niece    Tremor Neg Hx    Parkinson's disease Neg Hx    Colon cancer Neg Hx    Colon polyps Neg Hx    Esophageal cancer Neg Hx    Rectal cancer Neg Hx    Stomach cancer Neg Hx    Ulcerative colitis Neg Hx     Social History   Socioeconomic History   Marital status: Divorced    Spouse name: Not on file   Number of children: Not on file   Years of education: Not on file   Highest education level: Not on file  Occupational History   Not on file  Tobacco Use   Smoking status: Never   Smokeless tobacco: Never  Vaping Use   Vaping status: Never Used  Substance and Sexual Activity   Alcohol use: Never   Drug use: Never   Sexual activity: Yes    Birth control/protection: None  Other Topics Concern   Not on file  Social History Narrative   Not on file   Social Determinants of Health   Financial Resource Strain: Not on file  Food Insecurity: No Food Insecurity (03/11/2022)   Received from [facility], [facility]   Hunger Vital Sign    Worried About Running Out of Food in the Last Year: Never true    Ran Out of Food in the Last Year: Never true  Transportation Needs: Not on file  Physical Activity: Not on file  Stress: Not on file  Social Connections: Unknown (03/05/2022)   Received from [facility], [facility]   Social Network    Social Network: Not on file  Intimate Partner Violence: Unknown (03/05/2022)   Received from [facility], [facility]   HITS    Physically Hurt: Not on file    Insult or Talk Down  To: Not on file    Threaten Physical Harm: Not on file    Scream or Curse: Not on file    Physical Exam: Vital signs in last 24 hours: BP 98/66   Pulse 74   Temp 97.8 F (36.6 C)   Resp 14   Ht 5\' 4"  (1.626 m)   Wt 166 lb (75.3 kg)   LMP 06/29/2023 Comment: spotting lightly"  SpO2 100%   BMI 28.49 kg/m  GEN: NAD EYE: Sclerae anicteric ENT: MMM CV: Non-tachycardic Pulm: No increased work of breathing GI: Soft, NT/ND NEURO:  Alert &  Oriented   Eulah Pont, MD [facility]  07/15/2023 8:22 AM

## 2023-07-15 NOTE — Op Note (Signed)
[name] Procedure Date: 07/15/2023 7:51 AM MRN: [id number] Endoscopist: Particia Lather , , [id number] Age: 47 Referring MD:  Date of Birth: November 20, 1975 Gender: Female Account #: [id number] Procedure:                Colonoscopy Indications:              Screening for colorectal malignant neoplasm, This                            is the patient's first colonoscopy Medicines:                Monitored Anesthesia Care Procedure:                Pre-Anesthesia Assessment:                           - Prior to the procedure, a History and Physical                            was performed, and patient medications and                            allergies were reviewed. The patient's tolerance of                            previous anesthesia was also reviewed. The risks                            and benefits of the procedure and the sedation                            options and risks were discussed with the patient.                            All questions were answered, and informed consent                            was obtained. Prior Anticoagulants: The patient has                            taken no anticoagulant or antiplatelet agents. ASA                            Grade Assessment: II - A patient with mild systemic                            disease. After reviewing the risks and benefits,                            the patient was deemed in satisfactory condition to                            undergo the procedure.  After obtaining informed consent, the colonoscope                            was passed under direct vision. Throughout the                            procedure, the patient's blood pressure, pulse, and                            oxygen saturations were monitored continuously. The                            Olympus Scope [id number] was introduced through the                            anus and  advanced to the the cecum, identified by                            appendiceal orifice and ileocecal valve. The                            colonoscopy was performed without difficulty. The                            patient tolerated the procedure well. The quality                            of the bowel preparation was excellent. The                            ileocecal valve, appendiceal orifice, and rectum                            were photographed. Scope In: 8:24:06 AM Scope Out: 8:37:06 AM Scope Withdrawal Time: 0 hours 9 minutes 1 second  Total Procedure Duration: 0 hours 13 minutes 0 seconds  Findings:                 A 5 mm polyp was found in the sigmoid colon. The                            polyp was sessile. The polyp was removed with a                            cold snare. Resection and retrieval were complete.                           Non-bleeding internal hemorrhoids were found during                            retroflexion. Complications:            No immediate complications. Estimated Blood Loss:     Estimated blood loss was minimal. Impression:               -  One 5 mm polyp in the sigmoid colon, removed with                            a cold snare. Resected and retrieved.                           - Non-bleeding internal hemorrhoids. Recommendation:           - Discharge patient to home (with escort).                           - Await pathology results.                           - The findings and recommendations were discussed                            with the patient. Dr Particia Lather "Alan Ripper" Leonides Schanz,  07/15/2023 8:41:55 AM

## 2023-07-15 NOTE — Progress Notes (Signed)
0835 Ephedrine 10 mg given IV due to low BP, MD updated.   

## 2023-07-15 NOTE — Patient Instructions (Signed)
Handout given on polyps.  YOU HAD AN ENDOSCOPIC PROCEDURE TODAY AT [facility]:   Refer to the procedure report that was given to you for any specific questions about what was found during the examination.  If the procedure report does not answer your questions, please call your gastroenterologist to clarify.  If you requested that your care partner not be given the details of your procedure findings, then the procedure report has been included in a sealed envelope for you to review at your convenience later.  YOU SHOULD EXPECT: Some feelings of bloating in the abdomen. Passage of more gas than usual.  Walking can help get rid of the air that was put into your GI tract during the procedure and reduce the bloating. If you had a lower endoscopy (such as a colonoscopy or flexible sigmoidoscopy) you may notice spotting of blood in your stool or on the toilet paper. If you underwent a bowel prep for your procedure, you may not have a normal bowel movement for a few days.  Please Note:  You might notice some irritation and congestion in your nose or some drainage.  This is from the oxygen used during your procedure.  There is no need for concern and it should clear up in a day or so.  SYMPTOMS TO REPORT IMMEDIATELY:  Following lower endoscopy (colonoscopy or flexible sigmoidoscopy):  Excessive amounts of blood in the stool  Significant tenderness or worsening of abdominal pains  Swelling of the abdomen that is new, acute  Fever of 100F or higher  Following upper endoscopy (EGD)  Vomiting of blood or coffee ground material  New chest pain or pain under the shoulder blades  Painful or persistently difficult swallowing  New shortness of breath  Fever of 100F or higher  Black, tarry-looking stools  For urgent or emergent issues, a gastroenterologist can be reached at any hour by calling [phone]. Do not use MyChart messaging for urgent concerns.    DIET:  We do recommend a  small meal at first, but then you may proceed to your regular diet.  Drink plenty of fluids but you should avoid alcoholic beverages for 24 hours.  ACTIVITY:  You should plan to take it easy for the rest of today and you should NOT DRIVE or use heavy machinery until tomorrow (because of the sedation medicines used during the test).    FOLLOW UP: Our staff will call the number listed on your records the next business day following your procedure.  We will call around 7:15- 8:00 am to check on you and address any questions or concerns that you may have regarding the information given to you following your procedure. If we do not reach you, we will leave a message.     If any biopsies were taken you will be contacted by phone or by letter within the next 1-3 weeks.  Please call us at [phone] if you have not heard about the biopsies in 3 weeks.    SIGNATURES/CONFIDENTIALITY: You and/or your care partner have signed paperwork which will be entered into your electronic medical record.  These signatures attest to the fact that that the information above on your After Visit Summary has been reviewed and is understood.  Full responsibility of the confidentiality of this discharge information lies with you and/or your care-partner.

## 2023-07-15 NOTE — Progress Notes (Signed)
Called to room to assist during endoscopic procedure.  Patient ID and intended procedure confirmed with present staff. Received instructions for my participation in the procedure from the performing physician.  

## 2023-07-18 ENCOUNTER — Telehealth: Payer: Self-pay

## 2023-07-18 NOTE — Telephone Encounter (Signed)
Follow up call to pt, no answer.

## 2023-07-19 ENCOUNTER — Encounter: Payer: Self-pay | Admitting: Internal Medicine

## 2023-07-19 LAB — SURGICAL PATHOLOGY

## 2023-08-11 ENCOUNTER — Other Ambulatory Visit: Payer: Self-pay | Admitting: Family Medicine

## 2023-08-11 DIAGNOSIS — F411 Generalized anxiety disorder: Secondary | ICD-10-CM

## 2023-08-12 ENCOUNTER — Encounter: Payer: BC Managed Care – PPO | Admitting: Internal Medicine

## 2023-08-12 ENCOUNTER — Ambulatory Visit: Payer: BC Managed Care – PPO | Admitting: Family Medicine

## 2023-09-20 ENCOUNTER — Encounter: Payer: Self-pay | Admitting: Family Medicine

## 2023-09-20 ENCOUNTER — Ambulatory Visit: Payer: BC Managed Care – PPO | Admitting: Family Medicine

## 2023-09-20 VITALS — BP 98/60 | HR 65 | Temp 98.4°F | Ht 64.0 in | Wt 167.3 lb

## 2023-09-20 DIAGNOSIS — F411 Generalized anxiety disorder: Secondary | ICD-10-CM

## 2023-09-20 DIAGNOSIS — F900 Attention-deficit hyperactivity disorder, predominantly inattentive type: Secondary | ICD-10-CM

## 2023-09-20 DIAGNOSIS — N921 Excessive and frequent menstruation with irregular cycle: Secondary | ICD-10-CM

## 2023-09-20 MED ORDER — LISDEXAMFETAMINE DIMESYLATE 40 MG PO CAPS
40.0000 mg | ORAL_CAPSULE | ORAL | 0 refills | Status: DC
Start: 2023-11-14 — End: 2023-12-16

## 2023-09-20 MED ORDER — LISDEXAMFETAMINE DIMESYLATE 40 MG PO CAPS: 40.0000 mg | ORAL_CAPSULE | ORAL | 0 refills | Status: DC

## 2023-09-20 MED ORDER — LISDEXAMFETAMINE DIMESYLATE 40 MG PO CAPS
40.0000 mg | ORAL_CAPSULE | ORAL | 0 refills | Status: DC
Start: 2023-10-17 — End: 2023-12-22

## 2023-09-20 NOTE — Patient Instructions (Addendum)
Try 600 mg of ibuprofen to help reduce bleeding -- try taking it daily for a few days to see if this will help.   [email or web address]  [email or web address]  [facility]  [facility]

## 2023-09-20 NOTE — Progress Notes (Signed)
Established Patient Office Visit  Subjective   Patient ID: Victoria Parker, female    DOB: 10-Mar-1976  Age: 47 y.o. MRN: [id number]  Chief Complaint  Patient presents with   Medical Management of Chronic Issues   Pain    Patient complains of sudden onset of bilateral elbow and left knee pain x1 month, no known injury, tried castor oil only    Patient is here for follow up today, states that she stopped taking the lexapro due to because she "forgot" to take it. States she took it for about a month. States that she didn't really feel much of a difference. States that she also feels like she is pre-menopausal, has been taking vitamin supplements but doesn't seem to   ADHD-- pt continues on 30 mg daily, states that initially the current dose was working for her, however she is starting to slide back into her old habits. States that she is finding herself to be more distracted, not finishing tasks, and is continuing to feel overwhelmed. States that her lack of motivation is also becoming a problem. Zoning out more also.   Patient states she has been feeling more pain in her joints, fingers, elbows, hips, etc. States that it happens more at night/ early morning when she wakes up. Is also reporting daily vaginal spotting that has been going on for a few months.     Current Outpatient Medications  Medication Instructions   Cholecalciferol 1.25 MG (50000 UT) capsule 1 capsule, Weekly   escitalopram (LEXAPRO) 5 mg, Oral, Daily   ferrous gluconate (FERGON) 324 MG tablet Take by mouth.   folic acid (FOLVITE) 1 mg, Daily   ibuprofen (ADVIL) 800 mg, Oral, Every 8 hours PRN   IUD'S IU by Intrauterine route. Brand name unknown   [START ON 11/14/2023] lisdexamfetamine (VYVANSE) 40 mg, Oral, BH-each morning   [START ON 10/17/2023] lisdexamfetamine (VYVANSE) 40 mg, Oral, BH-each morning   lisdexamfetamine (VYVANSE) 40 mg, Oral, BH-each morning   Multiple Vitamin (MULTIVITAMIN) tablet 1 tablet, Daily    Omega-3 Fatty Acids (FISH OIL) 1000 MG CAPS Take by mouth.   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     Objective:     BP 98/60   Pulse 65   Temp 98.4 F (36.9 C) (Oral)   Ht 5\' 4"  (1.626 m)   Wt 167 lb 4.8 oz (75.9 kg)   SpO2 98%   BMI 28.72 kg/m    Physical Exam Vitals reviewed.  Constitutional:      Appearance: Normal appearance. She is normal weight.  Cardiovascular:     Rate and Rhythm: Normal rate and regular rhythm.     Heart sounds: Normal heart sounds. No murmur heard. Pulmonary:     Effort: Pulmonary effort is normal.     Breath sounds: Normal breath sounds. No wheezing.  Neurological:     Mental Status: She is alert and oriented to person, place, and time. Mental status is at baseline.      No results found for any visits on 09/20/23.    The ASCVD Risk score (Arnett DK, et  al., 2019) failed to calculate for the following reasons:   Cannot find a previous HDL lab    Assessment & Plan:  ADHD (attention deficit hyperactivity disorder), inattentive type Assessment & Plan: Symptoms are worsening, medication no longer effective, will increase to 40 mg daily and see her back in 3 months for her annual physical/ bloodwork.   Orders: -     Lisdexamfetamine Dimesylate; Take 1 capsule (40 mg total) by mouth every morning.  Dispense: 30 capsule; Refill: 0 -     Lisdexamfetamine Dimesylate; Take 1 capsule (40 mg total) by mouth every morning.  Dispense: 30 capsule; Refill: 0 -     Lisdexamfetamine Dimesylate; Take 1 capsule (40 mg total) by mouth every morning.   Dispense: 30 capsule; Refill: 0  Generalized anxiety disorder Assessment & Plan: Pt stopped taking her lexapro, I encouraged her to continue it for at least 2 months to help with her anxiety.    Menorrhagia with irregular cycle Assessment & Plan: Pt has IUD in place, I advised she speak with her GYN about the spotting.    I spent 30 minutes with patient today discussing her symptoms, I do think that she is in premenopause and that is likely the reason for her random aches and pains, worsening ADHD symptoms, and daily spotting. I recommended that she use 600 mg ibuprofen up to every 8 hours as needed to see if this would help.   Return in about 3 months (around 12/19/2023) for annual physical exam.    Karie Georges, MD

## 2023-09-20 NOTE — Assessment & Plan Note (Signed)
Pt stopped taking her lexapro, I encouraged her to continue it for at least 2 months to help with her anxiety.

## 2023-09-20 NOTE — Assessment & Plan Note (Signed)
Symptoms are worsening, medication no longer effective, will increase to 40 mg daily and see her back in 3 months for her annual physical/ bloodwork.

## 2023-09-20 NOTE — Assessment & Plan Note (Signed)
Pt has IUD in place, I advised she speak with her GYN about the spotting.

## 2023-11-19 ENCOUNTER — Other Ambulatory Visit: Payer: Self-pay | Admitting: Family Medicine

## 2023-11-19 DIAGNOSIS — F411 Generalized anxiety disorder: Secondary | ICD-10-CM

## 2023-12-14 DIAGNOSIS — Z01419 Encounter for gynecological examination (general) (routine) without abnormal findings: Secondary | ICD-10-CM | POA: Diagnosis not present

## 2023-12-14 DIAGNOSIS — Z1331 Encounter for screening for depression: Secondary | ICD-10-CM | POA: Diagnosis not present

## 2023-12-14 DIAGNOSIS — R87810 Cervical high risk human papillomavirus (HPV) DNA test positive: Secondary | ICD-10-CM | POA: Diagnosis not present

## 2023-12-14 DIAGNOSIS — Z01411 Encounter for gynecological examination (general) (routine) with abnormal findings: Secondary | ICD-10-CM | POA: Diagnosis not present

## 2023-12-14 DIAGNOSIS — Z1231 Encounter for screening mammogram for malignant neoplasm of breast: Secondary | ICD-10-CM | POA: Diagnosis not present

## 2023-12-14 DIAGNOSIS — F418 Other specified anxiety disorders: Secondary | ICD-10-CM | POA: Diagnosis not present

## 2023-12-14 DIAGNOSIS — R896 Abnormal cytological findings in specimens from other organs, systems and tissues: Secondary | ICD-10-CM | POA: Diagnosis not present

## 2023-12-14 LAB — HM MAMMOGRAPHY

## 2023-12-16 ENCOUNTER — Other Ambulatory Visit: Payer: Self-pay | Admitting: Family Medicine

## 2023-12-16 DIAGNOSIS — F900 Attention-deficit hyperactivity disorder, predominantly inattentive type: Secondary | ICD-10-CM

## 2023-12-16 MED ORDER — LISDEXAMFETAMINE DIMESYLATE 40 MG PO CAPS
40.0000 mg | ORAL_CAPSULE | ORAL | 0 refills | Status: DC
Start: 2023-12-16 — End: 2023-12-22

## 2023-12-20 LAB — HM PAP SMEAR: HPV, high-risk: NEGATIVE

## 2023-12-22 ENCOUNTER — Encounter: Payer: Self-pay | Admitting: Family Medicine

## 2023-12-22 ENCOUNTER — Ambulatory Visit: Payer: BC Managed Care – PPO | Admitting: Family Medicine

## 2023-12-22 VITALS — BP 98/70 | HR 60 | Temp 98.4°F | Ht 64.5 in | Wt 166.2 lb

## 2023-12-22 DIAGNOSIS — F900 Attention-deficit hyperactivity disorder, predominantly inattentive type: Secondary | ICD-10-CM

## 2023-12-22 DIAGNOSIS — E010 Iodine-deficiency related diffuse (endemic) goiter: Secondary | ICD-10-CM

## 2023-12-22 DIAGNOSIS — Z0001 Encounter for general adult medical examination with abnormal findings: Secondary | ICD-10-CM

## 2023-12-22 DIAGNOSIS — Z13220 Encounter for screening for lipoid disorders: Secondary | ICD-10-CM | POA: Diagnosis not present

## 2023-12-22 DIAGNOSIS — D508 Other iron deficiency anemias: Secondary | ICD-10-CM | POA: Diagnosis not present

## 2023-12-22 DIAGNOSIS — M65342 Trigger finger, left ring finger: Secondary | ICD-10-CM

## 2023-12-22 LAB — COMPREHENSIVE METABOLIC PANEL
ALT: 9 U/L (ref 0–35)
AST: 17 U/L (ref 0–37)
Albumin: 4.4 g/dL (ref 3.5–5.2)
Alkaline Phosphatase: 83 U/L (ref 39–117)
BUN: 18 mg/dL (ref 6–23)
CO2: 29 meq/L (ref 19–32)
Calcium: 10.2 mg/dL (ref 8.4–10.5)
Chloride: 103 meq/L (ref 96–112)
Creatinine, Ser: 0.95 mg/dL (ref 0.40–1.20)
GFR: 70.63 mL/min (ref >60.00)
Glucose, Bld: 87 mg/dL (ref 70–99)
Potassium: 4.5 meq/L (ref 3.5–5.1)
Sodium: 138 meq/L (ref 135–145)
Total Bilirubin: 0.4 mg/dL (ref 0.2–1.2)
Total Protein: 7.7 g/dL (ref 6.0–8.3)

## 2023-12-22 LAB — LIPID PANEL
Cholesterol: 214 mg/dL — ABNORMAL HIGH (ref 0–200)
HDL: 65.9 mg/dL (ref >39.00)
LDL Cholesterol: 131 mg/dL — ABNORMAL HIGH (ref 0–99)
NonHDL: 148.59
Total CHOL/HDL Ratio: 3
Triglycerides: 90 mg/dL (ref 0.0–149.0)
VLDL: 18 mg/dL (ref 0.0–40.0)

## 2023-12-22 LAB — CBC WITH DIFFERENTIAL/PLATELET
Basophils Absolute: 0.1 10*3/uL (ref 0.0–0.1)
Basophils Relative: 1 % (ref 0.0–3.0)
Eosinophils Absolute: 0.2 10*3/uL (ref 0.0–0.7)
Eosinophils Relative: 3.3 % (ref 0.0–5.0)
HCT: 38.7 % (ref 36.0–46.0)
Hemoglobin: 12.8 g/dL (ref 12.0–15.0)
Lymphocytes Relative: 25.7 % (ref 12.0–46.0)
Lymphs Abs: 1.6 10*3/uL (ref 0.7–4.0)
MCHC: 33 g/dL (ref 30.0–36.0)
MCV: 88.7 fl (ref 78.0–100.0)
Monocytes Absolute: 0.5 10*3/uL (ref 0.1–1.0)
Monocytes Relative: 7.9 % (ref 3.0–12.0)
Neutro Abs: 3.9 10*3/uL (ref 1.4–7.7)
Neutrophils Relative %: 62.1 % (ref 43.0–77.0)
Platelets: 298 10*3/uL (ref 150.0–400.0)
RBC: 4.36 Mil/uL (ref 3.87–5.11)
RDW: 15 % (ref 11.5–15.5)
WBC: 6.2 10*3/uL (ref 4.0–10.5)

## 2023-12-22 LAB — TSH: TSH: 2.01 u[IU]/mL (ref 0.35–5.50)

## 2023-12-22 MED ORDER — LISDEXAMFETAMINE DIMESYLATE 40 MG PO CAPS
40.0000 mg | ORAL_CAPSULE | ORAL | 0 refills | Status: DC
Start: 2024-01-09 — End: 2024-03-12

## 2023-12-22 MED ORDER — LISDEXAMFETAMINE DIMESYLATE 40 MG PO CAPS
40.0000 mg | ORAL_CAPSULE | ORAL | 0 refills | Status: DC
Start: 2024-02-02 — End: 2024-03-12

## 2023-12-22 MED ORDER — LISDEXAMFETAMINE DIMESYLATE 40 MG PO CAPS
40.0000 mg | ORAL_CAPSULE | ORAL | 0 refills | Status: DC
Start: 2024-03-02 — End: 2024-03-12

## 2023-12-22 NOTE — Patient Instructions (Signed)

## 2023-12-22 NOTE — Progress Notes (Signed)
 Complete physical exam  Patient: Victoria Parker   DOB: 05/25/1976   47 y.o. Female  MRN: [id number]  Subjective:    Chief Complaint  Patient presents with   Annual Exam    Victoria Parker is a 48 y.o. female who presents today for a complete physical exam. She reports consuming a general diet. The patient does not participate in regular exercise at present. She generally feels well. She reports sleeping poorly, states that she cannot fall asleep at night, has nighttime awakenings 1-2 times per night. She does not have additional problems to discuss today.    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   Cholecalciferol 1.25 MG (50000 UT) capsule Take 1 capsule by mouth once a week.   ferrous gluconate (FERGON) 324 MG tablet Take by mouth.   folic acid (FOLVITE) 1 MG tablet Take 1 mg by mouth daily.   ibuprofen (ADVIL) 200 MG tablet Take 4 tablets (800 mg total) by mouth every 8 (eight) hours as needed for moderate pain.   IUD'S IU by Intrauterine route. Brand name unknown   Multiple Vitamin (MULTIVITAMIN) tablet Take 1 tablet by mouth  daily.   Omega-3 Fatty Acids (FISH OIL) 1000 MG CAPS Take by mouth.   propranolol (INDERAL) 10 MG tablet Take 1 tablet (10 mg total) by mouth every morning.   [DISCONTINUED] escitalopram (LEXAPRO) 5 MG tablet TAKE 1 TABLET (5 MG TOTAL) BY MOUTH DAILY.   [DISCONTINUED] lisdexamfetamine (VYVANSE) 40 MG capsule Take 1 capsule (40 mg total) by mouth every morning.   [DISCONTINUED] lisdexamfetamine (VYVANSE) 40 MG capsule Take 1 capsule (40 mg total) by mouth every morning.   [DISCONTINUED] lisdexamfetamine (VYVANSE) 40 MG capsule Take 1 capsule (40 mg total) by mouth every morning.   No facility-administered medications prior to visit.    Review of Systems  HENT:  Negative for hearing loss.   Eyes:  Negative for blurred vision.  Respiratory:  Negative for shortness of breath.   Cardiovascular:  Negative for chest pain.  Gastrointestinal: Negative.   Genitourinary: Negative.   Musculoskeletal:  Negative for back pain.  Neurological:  Negative for headaches.  Psychiatric/Behavioral:  Negative for depression.        Objective:     BP 98/70   Pulse 60   Temp 98.4 F (36.9 C) (Oral)   Ht 5' 4.5" (1.638 m)   Wt 166 lb 3.2 oz (75.4 kg)   SpO2 99%   BMI 28.09 kg/m  Physical Exam Vitals reviewed.  Constitutional:      Appearance: Normal appearance. She is well-groomed and normal weight.  HENT:     Right Ear: Tympanic membrane and ear canal normal.     Left Ear: Tympanic membrane and ear canal normal.     Mouth/Throat:     Mouth: Mucous membranes are moist.     Pharynx: No posterior oropharyngeal erythema.  Eyes:     Conjunctiva/sclera: Conjunctivae normal.  Neck:     Thyroid: No thyromegaly.  Cardiovascular:     Rate and Rhythm: Normal rate and regular rhythm.     Pulses: Normal pulses.     Heart sounds: S1 normal and S2 normal.  Pulmonary:     Effort: Pulmonary effort is normal.     Breath sounds: Normal breath sounds and air entry.  Abdominal:     General: Abdomen  is flat. Bowel sounds are normal.     Palpations: Abdomen is soft.  Musculoskeletal:     Right lower leg: No edema.     Left lower leg: No edema.  Lymphadenopathy:     Cervical: No cervical adenopathy.  Neurological:     Mental Status: She is alert and oriented to person, place, and time. Mental status is at baseline.     Gait: Gait is intact.  Psychiatric:        Mood and Affect: Mood and affect normal.        Speech: Speech normal.        Behavior: Behavior normal.        Judgment: Judgment normal.     No results found for any visits on 12/22/23.     Assessment & Plan:    Routine Health Maintenance and Physical Exam   There is no immunization history on file for this patient.  Health Maintenance  Topic Date Due   INFLUENZA VACCINE  01/02/2024 (Originally 05/05/2023)   COVID-19 Vaccine (1) 02/10/2024 (Originally 01/13/1980)   Hepatitis C Screening  02/10/2024 (Originally 01/12/1993)   HIV Screening  02/10/2024 (Originally 01/12/1990)   DTaP/Tdap/Td (1 - Tdap) 09/19/2024 (Originally 01/12/1994)   Cervical Cancer Screening (HPV/Pap Cotest)  12/13/2027   Colonoscopy  07/14/2033   HPV VACCINES  Aged Out    Discussed health benefits of physical activity, and encouraged her to engage in regular exercise appropriate for her age and condition.  Encounter for general adult medical examination with abnormal findings Physical exam findings are normal except for continued tremor in both hands and left fourth finger trigger. Will send to hand surgery for evaluation. Counseled patient on healthy sleep habits, handouts given on healthy eating and exercise. Pt was counseled also on recommendations for exercise and stress management. Labs ordered for annual surveillance.   -     Comprehensive metabolic panel; Future -     CBC with Differential/Platelet; Future  Other iron deficiency anemia -     Iron, TIBC and Ferritin Panel; Future  Lipid screening -     Lipid panel; Future  ADHD  (attention deficit hyperactivity disorder), inattentive type -     Lisdexamfetamine Dimesylate; Take 1 capsule (40 mg total) by mouth every morning.  Dispense: 30 capsule; Refill: 0 -     Lisdexamfetamine Dimesylate; Take 1 capsule (40 mg total) by mouth every morning.  Dispense: 30 capsule; Refill: 0 -     Lisdexamfetamine Dimesylate; Take 1 capsule (40 mg total) by mouth every morning.  Dispense: 30 capsule; Refill: 0  Trigger ring finger of left hand -  Ambulatory referral to Hand Surgery  Thyromegaly -     TSH; Future    Return in 4 months (on 04/22/2024) for ADHD medication refills, ok to make it a video visit.Karie Georges, MD

## 2023-12-23 ENCOUNTER — Encounter: Payer: Self-pay | Admitting: Family Medicine

## 2023-12-23 LAB — IRON,TIBC AND FERRITIN PANEL
%SAT: 15 % — ABNORMAL LOW (ref 16–45)
Ferritin: 10 ng/mL — ABNORMAL LOW (ref 16–232)
Iron: 69 ug/dL (ref 40–190)
TIBC: 447 ug/dL (ref 250–450)

## 2024-01-06 DIAGNOSIS — M65342 Trigger finger, left ring finger: Secondary | ICD-10-CM | POA: Diagnosis not present

## 2024-02-03 ENCOUNTER — Ambulatory Visit: Payer: Self-pay

## 2024-02-03 NOTE — Telephone Encounter (Signed)
  Chief Complaint: itching-possible rash but patient states no visible rash Symptoms: generalized itching then she is having swelling to area of itching Frequency: couple of weeks Pertinent Negatives: Patient denies fever, CP, SOB Disposition: [] ED /[] Urgent Care (no appt availability in office) / [x] Appointment(In office/virtual)/ []  [facility]/ [] Home Care/ [] Refused Recommended Disposition /[] [facility]/ []  Follow-up with PCP Additional Notes: patient calling with itching that has been going on for a couple of weeks. Patient denies a visible rash but states the itching has been intense and hasn't responded to OTC medications. Patient denies new soap or detergent but states that she believes she was bit by some insect while on a church retreat. Unsure of what bit her but states she believes the itching started after that. Patient is requesting an appointment. Patient is recommended to be seen in three days. Appointment made for 02/06/2024 at 1:00 PM with another provider. Verbalized understanding and all questions answered. Patient was instructed if itching became worse to be checked out at the ED or UC over the weekend.   Copied from CRM [id number]. Topic: Clinical - Red Word Triage >> Feb 03, 2024  9:36 AM Lotus Round B wrote: [facility] that prompted transfer to Nurse Triage: bad rash all over body that is swelling up Reason for Disposition  [1] Widespread itching AND [2] cause unknown AND [3] present > 48 hours  (Exception: Caller knows the cause and can eliminate it.)  Answer Assessment - Initial Assessment Questions 1. DESCRIPTION: "Describe the itching you are having."     Patient reports getting itchy for the last few weeks. Patient reports that she believes she was bit by some insect and developing severe itching  2. SEVERITY: "How bad is it?"    - MILD: Doesn't interfere with normal activities.   - MODERATE-SEVERE: Interferes with work, school, sleep, or other  activities.      Moderate-severe 3. SCRATCHING: "Are there any scratch marks? Bleeding?"     no 4. ONSET: "When did this begin?"      Started a couple of weeks ago 5. CAUSE: "What do you think is causing the itching?" (ask about swimming pools, pollen, animals, soaps, etc.)     unsure 6. OTHER SYMPTOMS: "Do you have any other symptoms?"      Patient reports skin gets very red and swells after she has been scratching  Protocols used: Itching - [facility]

## 2024-02-03 NOTE — Telephone Encounter (Signed)
 Appt with Kingsley Penny on 5/5.

## 2024-02-04 NOTE — Telephone Encounter (Signed)
 Noted- ok to close.

## 2024-02-06 ENCOUNTER — Encounter: Payer: Self-pay | Admitting: Family Medicine

## 2024-02-06 ENCOUNTER — Ambulatory Visit: Admitting: Family Medicine

## 2024-02-06 VITALS — BP 108/70 | HR 75 | Temp 98.5°F | Ht 64.5 in | Wt 162.0 lb

## 2024-02-06 DIAGNOSIS — L299 Pruritus, unspecified: Secondary | ICD-10-CM

## 2024-02-06 DIAGNOSIS — T7840XA Allergy, unspecified, initial encounter: Secondary | ICD-10-CM

## 2024-02-06 MED ORDER — HYDROXYZINE PAMOATE 25 MG PO CAPS
25.0000 mg | ORAL_CAPSULE | Freq: Three times a day (TID) | ORAL | 0 refills | Status: AC | PRN
Start: 2024-02-06 — End: 2024-02-16

## 2024-02-06 NOTE — Progress Notes (Signed)
 Acute Office Visit   Subjective:  Patient ID: Victoria Parker, female    DOB: 1976-09-28, 48 y.o.   MRN: [id number]  Chief Complaint  Patient presents with   itching     Body itching and swelling x2 weeks     HPI Patient is here for an acute visit. She reports she went on a camping trip early March. Reports she got bit by "something" on one of her legs. She reports she didn't see any marks on her legs. The next day she started experiencing itching on arms and hands. Since, it has moved to all over, different areas. Intermittent. She reports she has tried over the counter Hydrocortisone creams and Benadryl. Neither medication has helped. The itching has intensified. Also, when scratching, the area she scratches will swell or whelp up after.   Denies changes in daily products used, like soaps, detergents, or lotions. Only different environment is when she went camping.   Denies any throat swelling, lip swelling, or shortness of breath.   ROS See HPI above      Objective:   BP 108/70   Pulse 75   Temp 98.5 F (36.9 C) (Oral)   Ht 5' 4.5" (1.638 m)   Wt 162 lb (73.5 kg)   LMP  (LMP Unknown)   SpO2 98%   BMI 27.38 kg/m    Physical Exam Vitals reviewed.  Constitutional:      General: She is not in acute distress.    Appearance: Normal appearance. She is not ill-appearing, toxic-appearing or diaphoretic.  Eyes:     General:        Right eye: No discharge.        Left eye: No discharge.     Conjunctiva/sclera: Conjunctivae normal.  Cardiovascular:     Rate and Rhythm: Normal rate.  Pulmonary:     Effort: Pulmonary effort is normal. No respiratory distress.  Musculoskeletal:        General: Normal range of motion.  Skin:    General: Skin is warm and dry.     Comments: See picture included of right arm from itching and scratching.   Neurological:     General: No focal deficit present.     Mental Status: She is alert and oriented to person, place, and time. Mental  status is at baseline.  Psychiatric:        Mood and Affect: Mood normal.        Behavior: Behavior normal.        Thought Content: Thought content normal.        Judgment: Judgment normal.        Assessment & Plan:  Itching -     hydrOXYzine Pamoate; Take 1 capsule (25 mg total) by mouth every 8 (eight) hours as needed for up to 10 days.  Dispense: 30 capsule; Refill: 0 -     Ambulatory referral to Allergy  Allergic reaction, initial encounter -     Ambulatory referral to Allergy  -Through shared decision making, decided on Hydroxyzine over a Prednisone dose pack. Prescribed Hydroxyzine 25mg  tablet, take 1 tablet every 8 hours as needed for itching. Caution: Medication can cause drowsiness, it is does cause too much drowsiness, send a MyChart message or call the office, will decrease dose.  -Placed a referral to allergy for possible reason for allergic reaction. Concerned it could be environmental.  -Follow up if not improved or symptoms become worse.  -Discussed red flags for prompt emergency department visit.  -  Patient has pictures on her phone that she is going to upload that have occurred since March.  Dewanda Fennema, NP

## 2024-02-06 NOTE — Patient Instructions (Signed)
-  It was a pleasure to care for you today. -Prescribed Hydroxyzine 25mg  tablet, take 1 tablet every 8 hours as needed for itching. Caution: Medication can cause drowsiness, it is does cause too much drowsiness, send a MyChart message or call the office, will decrease dose.  -Placed a referral to allergy for possible reason for allergic reaction. Concerned it could be environmental.  -Follow up if not improved or symptoms become worse.  -Discussed red flags for prompt emergency department visit.

## 2024-03-12 ENCOUNTER — Encounter: Payer: Self-pay | Admitting: Family Medicine

## 2024-03-12 DIAGNOSIS — F900 Attention-deficit hyperactivity disorder, predominantly inattentive type: Secondary | ICD-10-CM

## 2024-03-12 MED ORDER — LISDEXAMFETAMINE DIMESYLATE 40 MG PO CAPS: 40.0000 mg | ORAL_CAPSULE | ORAL | 0 refills | Status: DC

## 2024-03-29 ENCOUNTER — Ambulatory Visit: Admitting: Allergy

## 2024-04-23 ENCOUNTER — Ambulatory Visit: Admitting: Family Medicine

## 2024-04-24 ENCOUNTER — Ambulatory Visit (INDEPENDENT_AMBULATORY_CARE_PROVIDER_SITE_OTHER): Admitting: Family Medicine

## 2024-04-24 ENCOUNTER — Encounter: Payer: Self-pay | Admitting: Family Medicine

## 2024-04-24 VITALS — BP 118/80 | HR 57 | Temp 97.9°F | Ht 64.5 in | Wt 162.4 lb

## 2024-04-24 DIAGNOSIS — F900 Attention-deficit hyperactivity disorder, predominantly inattentive type: Secondary | ICD-10-CM | POA: Diagnosis not present

## 2024-04-24 MED ORDER — LISDEXAMFETAMINE DIMESYLATE 40 MG PO CAPS: 40.0000 mg | ORAL_CAPSULE | ORAL | 0 refills | Status: DC

## 2024-04-24 NOTE — Progress Notes (Signed)
 Established Patient Office Visit  Subjective   Patient ID: Victoria Parker, female    DOB: Jul 19, 1976  Age: 48 y.o. MRN: [id number]  Chief Complaint  Patient presents with   Medical Management of Chronic Issues     Pt is here for medication refills for her ADHD medications. She reports that things are about the same today as they were at previous visits. Is taking the vyvanse  as prescribed. Reports she has follow up with her GYN and her ENT specialist for the thyroid . Was given bupropion by her GYN and thinks that it is helping her feel better.   States that the tremors that she has are getting worse. We had tried antidepressants in the past but they did not seem to be effective. States that her headaches are getting worse as well, thinks that she needs glasses.   Anxiety-- pt reports that she has been taking the propranolol  as needed -- not every day. States that it does work to help with the tremor and also with her anxiety at times, no side effects reported.   Patient states she just recently started her iron supplements, states that she takes them off and on, states they make her constipated so she has to take a probiotics.     Current Outpatient Medications  Medication Instructions   buPROPion (WELLBUTRIN SR) 150 mg, 2 times daily   Cholecalciferol 1.25 MG (50000 UT) capsule 1 capsule, Weekly   ferrous gluconate (FERGON) 324 MG tablet Take by mouth.   folic acid (FOLVITE) 1 mg, Daily   ibuprofen  (ADVIL ) 800 mg, Oral, Every 8 hours PRN   IUD'S IU by Intrauterine route. Brand name unknown   lisdexamfetamine (VYVANSE ) 40 mg, Oral, BH-each morning   [START ON 05/21/2024] lisdexamfetamine (VYVANSE ) 40 mg, Oral, BH-each morning   [START ON 06/18/2024] lisdexamfetamine (VYVANSE ) 40 mg, Oral, BH-each morning   Multiple Vitamin (MULTIVITAMIN) tablet 1 tablet, Daily   Omega-3 Fatty Acids (FISH OIL) 1000 MG CAPS Take by mouth.   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     Objective:     BP 118/80   Pulse (!) 57   Temp 97.9 F (36.6 C) (Oral)   Ht 5' 4.5 (1.638 m)   Wt 162 lb 6.4 oz (73.7 kg)   SpO2 100%   BMI 27.45 kg/m    Physical Exam Vitals reviewed.  Constitutional:      Appearance: Normal appearance. She is normal weight.  Cardiovascular:     Rate and Rhythm: Normal rate and regular rhythm.     Heart sounds: Normal heart sounds. No murmur heard. Pulmonary:     Effort: Pulmonary effort is normal.     Breath sounds: Normal breath sounds. No wheezing.  Neurological:     Mental Status: She is alert and oriented to person, place, and time. Mental status is at baseline.      No results found for any visits on 04/24/24.    The 10-year ASCVD risk score (Arnett DK, et al., 2019) is: 0.8%    Assessment & Plan:  ADHD (attention deficit hyperactivity disorder), inattentive  type Assessment & Plan: GYN started her on bupropion 150 mg BID in addition to the vyvanse  and she reports her mood is better on the bupropion, we discussed that we will need to watch her blood pressure and HR while on both medications, will refill the vyvanse  40 mg daily for the next 3 months.   Orders: -     Lisdexamfetamine Dimesylate ; Take 1 capsule (40 mg total) by mouth every morning.  Dispense: 30 capsule; Refill: 0 -     Lisdexamfetamine Dimesylate ; Take 1 capsule (40 mg total) by mouth every morning.  Dispense: 30 capsule; Refill: 0 -     Lisdexamfetamine Dimesylate ; Take 1 capsule (40 mg total) by mouth every morning.  Dispense: 30 capsule; Refill: 0     Return  in about 4 months (around 08/25/2024) for video visit for ADHD med refills.    Heron CHRISTELLA Sharper, MD

## 2024-04-24 NOTE — Assessment & Plan Note (Signed)
 GYN started her on bupropion 150 mg BID in addition to the vyvanse  and she reports her mood is better on the bupropion, we discussed that we will need to watch her blood pressure and HR while on both medications, will refill the vyvanse  40 mg daily for the next 3 months.

## 2024-04-27 ENCOUNTER — Ambulatory Visit: Payer: BC Managed Care – PPO | Admitting: "Endocrinology

## 2024-04-30 ENCOUNTER — Ambulatory Visit: Admitting: "Endocrinology

## 2024-04-30 ENCOUNTER — Encounter: Payer: Self-pay | Admitting: "Endocrinology

## 2024-04-30 VITALS — BP 100/80 | Ht 64.5 in

## 2024-04-30 DIAGNOSIS — E042 Nontoxic multinodular goiter: Secondary | ICD-10-CM | POA: Diagnosis not present

## 2024-04-30 NOTE — Progress Notes (Signed)
 Outpatient Endocrinology Note Victoria Birmingham, MD  04/30/24   Victoria Parker 01-05-1976 [id number]  Referring Provider: Ozell Heron HERO, MD Primary Care Provider: Ozell Heron HERO, MD Subjective  No chief complaint on file.   Assessment & Plan  Diagnoses and all orders for this visit:  Multinodular goiter -     US  THYROID ; Future   Ahnesty Victoria Parker is currently not taking any thyroid  medication. Patient is currently biochemically euthyroid, last checked 12/2023.  Educated on thyroid  axis.  No thyroid  medication indicated.   02/11/23 thyroid  ultrasound multinodular goiter. Nodule # 2 Location: Left; Mid 3.4 cm; Other 2 dimensions: 2.52.4 cm Dominant left-sided thyroid  nodule (labeled 2), potentially correlating with the palpable area of concern, meets imaging criteria to recommend percutaneous sampling as indicated. Neither of the additional punctate (sub 0.8 cm) thyroid  nodules meet imaging criteria to recommend percutaneous sampling or continued dedicated follow-up. Ordered follow up thyroid  U/S  I have reviewed current medications, nurse's notes, allergies, vital signs, past medical and surgical history, family medical history, and social history for this encounter. Counseled patient on symptoms, examination findings, lab findings, imaging results, treatment decisions and monitoring and prognosis. The patient understood the recommendations and agrees with the treatment plan. All questions regarding treatment plan were fully answered.   Return in about 4 weeks (around 05/28/2024).   Victoria Birmingham, MD  04/30/24   I have reviewed current medications, nurse's notes, allergies, vital signs, past medical and surgical history, family medical history, and social history for this encounter. Counseled patient on symptoms, examination findings, lab findings, imaging results, treatment decisions and monitoring and prognosis. The patient understood the recommendations and  agrees with the treatment plan. All questions regarding treatment plan were fully answered.   History of Present Illness Victoria Parker is a 48 y.o. year old female who presents to our clinic to follow up on multinodular goiter.    Patient reports history of thyroid  nodule around/earlier than 2018. Reports having a FNA done back in [place] around 2018 that was benign per pt.  Patient c/o anxiety and associated tremor and palpitation. Patient is getting treatment for it.  Symptoms suggestive of HYPOTHYROIDISM:  fatigue Yes weight gain No cold intolerance  Yes constipation  Yes, on iron meds  Symptoms suggestive of HYPERTHYROIDISM:  weight loss  No heat intolerance No hyperdefecation  No palpitations  Yes  Compressive symptoms:  dysphagia  No dysphonia  No positional dyspnea (especially with simultaneous arms elevation)  No  Smokes  No On biotin  No Personal history of head/neck surgery/irradiation  No  02/12/23 THYROID  ULTRASOUND   TECHNIQUE: Ultrasound examination of the thyroid  gland and adjacent soft tissues was performed.   COMPARISON:  None Available.   FINDINGS: Parenchymal Echotexture: Normal   Isthmus: Normal in size measuring 0.2 cm in diameter   Right lobe: Normal in size measuring 4.1 x 1.9 x 1.7 cm   Left lobe: Enlarged measuring 5.2 x 2.7 x 2.5 cm   _________________________________________________________   Estimated total number of nodules >/= 1 cm: 1   Number of spongiform nodules >/=  2 cm not described below (TR1): 0   Number of mixed cystic and solid nodules >/= 1.5 cm not described below (TR2): 0   _________________________________________________________   There is a punctate (approximate 0.8 cm) isoechoic nodule within the superior pole of the right lobe of the thyroid  (labeled 1), which does not meet criteria to recommend percutaneous sampling or continued dedicated follow-up.    _________________________________________________________  Nodule # 2:   Location: Left; Mid   Maximum size: 3.4 cm; Other 2 dimensions: 2.52.4 cm   Composition: solid/almost completely solid (2)   Echogenicity: isoechoic (1)   Shape: not taller-than-wide (0)   Margins: smooth (0)   Echogenic foci: none (0)   ACR TI-RADS total points: 3.   ACR TI-RADS risk category: TR3 (3 points).   ACR TI-RADS recommendations:   **Given size (>/= 2.5 cm) and appearance, fine needle aspiration of this mildly suspicious nodule should be considered based on TI-RADS criteria.   _________________________________________________________   There is a punctate (approximately 0.6 cm) isoechoic nodule within the superior pole of the left lobe of the thyroid  (labeled 3), which does not meet criteria to recommend percutaneous sampling or continued dedicated follow-up.   IMPRESSION: 1. Findings suggestive of multinodular goiter. 2. Dominant left-sided thyroid  nodule (labeled 2), potentially correlating with the palpable area of concern, meets imaging criteria to recommend percutaneous sampling as indicated. 3. Neither of the additional punctate (sub 0.8 cm) thyroid  nodules meet imaging criteria to recommend percutaneous sampling or continued dedicated follow-up.   Physical Exam  BP 100/80   Ht 5' 4.5 (1.638 m)   SpO2 98%   BMI 27.45 kg/m  Constitutional: well developed, well nourished Head: normocephalic, atraumatic, no exophthalmos Eyes: sclera anicteric, no redness Neck: no thyromegaly, no thyroid  tenderness; + nodule palpated Lungs: normal respiratory effort Neurology: alert and oriented, + fine hand tremor Skin: dry, no appreciable rashes Musculoskeletal: no appreciable defects Psychiatric: normal mood and affect  Allergies Allergies  Allergen Reactions   Vicodin [Hydrocodone-Acetaminophen ] Anaphylaxis    Current Medications Patient's Medications  New Prescriptions    No medications on file  Previous Medications   BUPROPION (WELLBUTRIN SR) 150 MG 12 HR TABLET    Take 150 mg by mouth 2 (two) times daily.   CHOLECALCIFEROL 1.25 MG (50000 UT) CAPSULE    Take 1 capsule by mouth once a week.   FERROUS GLUCONATE (FERGON) 324 MG TABLET    Take by mouth.   FOLIC ACID (FOLVITE) 1 MG TABLET    Take 1 mg by mouth daily.   IBUPROFEN  (ADVIL ) 200 MG TABLET    Take 4 tablets (800 mg total) by mouth every 8 (eight) hours as needed for moderate pain.   IUD'S IU    by Intrauterine route. Brand name unknown   LISDEXAMFETAMINE (VYVANSE ) 40 MG CAPSULE    Take 1 capsule (40 mg total) by mouth every morning.   LISDEXAMFETAMINE (VYVANSE ) 40 MG CAPSULE    Take 1 capsule (40 mg total) by mouth every morning.   LISDEXAMFETAMINE (VYVANSE ) 40 MG CAPSULE    Take 1 capsule (40 mg total) by mouth every morning.   MULTIPLE VITAMIN (MULTIVITAMIN) TABLET    Take 1 tablet by mouth daily.   OMEGA-3 FATTY ACIDS (FISH OIL) 1000 MG CAPS    Take by mouth.   PROPRANOLOL  (INDERAL ) 10 MG TABLET    Take 1 tablet (10 mg total) by mouth every morning.  Modified Medications   No medications on file  Discontinued Medications   No medications on file    Past Medical History Past Medical History:  Diagnosis Date   ADHD    Anemia    Anxiety    Benign essential tremor    Depression    Endometrial polyp    Tremor     Past Surgical History Past Surgical History:  Procedure Laterality Date   CESAREAN SECTION  2004   DILATATION &  CURETTAGE/HYSTEROSCOPY WITH MYOSURE N/A 09/11/2018   Procedure: DILATATION & CURETTAGE/HYSTEROSCOPY;  Surgeon: Johnnye Ade, MD;  Location[facility];  Service: Gynecology;  Laterality: N/A;   LAPAROSCOPIC TUBAL LIGATION Bilateral 09/11/2018   Procedure: LAPAROSCOPIC TUBAL LIGATION with Filshie clips;  Surgeon: Johnnye Ade, MD;  Location: [facility];  Service: Gynecology;  Laterality: Bilateral;    Family History family  history includes Crohn's disease in her niece; Diabetes in her father; Hypertension in her mother.  Social History Social History   Socioeconomic History   Marital status: Divorced    Spouse name: Not on file   Number of children: Not on file   Years of education: Not on file   Highest education level: Not on file  Occupational History   Not on file  Tobacco Use   Smoking status: Never   Smokeless tobacco: Never  Vaping Use   Vaping status: Never Used  Substance and Sexual Activity   Alcohol use: Never   Drug use: Never   Sexual activity: Yes    Birth control/protection: None  Other Topics Concern   Not on file  Social History Narrative   Not on file   Social Drivers of Health   Financial Resource Strain: Not on file  Food Insecurity: Low Risk  (01/06/2024)   Received from [facility]   Hunger Vital Sign    Within the past 12 months, you worried that your food would run out before you got money to buy more: Never true    Within the past 12 months, the food you bought just didn't last and you didn't have money to get more. : Never true  Transportation Needs: No Transportation Needs (01/06/2024)   Received from [facility]    In the past 12 months, has lack of reliable transportation kept you from medical appointments, meetings, work or from getting things needed for daily living? : No  Physical Activity: Not on file  Stress: Not on file  Social Connections: Unknown (03/05/2022)   Received from [facility]   Social Network    Social Network: Not on file  Intimate Partner Violence: Unknown (03/05/2022)   Received from [facility]   HITS    Physically Hurt: Not on file    Insult or Talk Down To: Not on file    Threaten Physical Harm: Not on file    Scream or Curse: Not on file    Laboratory Investigations Lab Results  Component Value Date   TSH 2.01 12/22/2023   TSH 2.22 04/25/2023   TSH 2.98 02/10/2023   FREET4 0.86 04/25/2023     No  results found for: TSI   No components found for: TRAB   Lab Results  Component Value Date   CHOL 214 (H) 12/22/2023   Lab Results  Component Value Date   HDL 65.90 12/22/2023   Lab Results  Component Value Date   LDLCALC 131 (H) 12/22/2023   Lab Results  Component Value Date   TRIG 90.0 12/22/2023   Lab Results  Component Value Date   CHOLHDL 3 12/22/2023   Lab Results  Component Value Date   CREATININE 0.95 12/22/2023   Lab Results  Component Value Date   GFR 70.63 12/22/2023      Component Value Date/Time   NA 138 12/22/2023 1008   K 4.5 12/22/2023 1008   CL 103 12/22/2023 1008   CO2 29 12/22/2023 1008   GLUCOSE 87 12/22/2023 1008   BUN  18 12/22/2023 1008   CREATININE 0.95 12/22/2023 1008   CALCIUM 10.2 12/22/2023 1008   PROT 7.7 12/22/2023 1008   ALBUMIN 4.4 12/22/2023 1008   AST 17 12/22/2023 1008   ALT 9 12/22/2023 1008   ALKPHOS 83 12/22/2023 1008   BILITOT 0.4 12/22/2023 1008      Latest Ref Rng & Units 12/22/2023   10:08 AM  BMP  Glucose 70 - 99 mg/dL 87   BUN 6 - 23 mg/dL 18   Creatinine 9.59 - 1.20 mg/dL 9.04   Sodium 864 - 854 mEq/L 138   Potassium 3.5 - 5.1 mEq/L 4.5   Chloride 96 - 112 mEq/L 103   CO2 19 - 32 mEq/L 29   Calcium 8.4 - 10.5 mg/dL 89.7        Component Value Date/Time   WBC 6.2 12/22/2023 1008   RBC 4.36 12/22/2023 1008   HGB 12.8 12/22/2023 1008   HCT 38.7 12/22/2023 1008   PLT 298.0 12/22/2023 1008   MCV 88.7 12/22/2023 1008   MCH 26.9 09/11/2018 0548   MCHC 33.0 12/22/2023 1008   RDW 15.0 12/22/2023 1008   LYMPHSABS 1.6 12/22/2023 1008   MONOABS 0.5 12/22/2023 1008   EOSABS 0.2 12/22/2023 1008   BASOSABS 0.1 12/22/2023 1008      Parts of this note may have been dictated using voice recognition software. There may be variances in spelling and vocabulary which are unintentional. Not all errors are proofread. Please notify the dino if any discrepancies are noted or if the meaning of any statement is not  clear.

## 2024-05-01 DIAGNOSIS — F418 Other specified anxiety disorders: Secondary | ICD-10-CM | POA: Diagnosis not present

## 2024-05-01 DIAGNOSIS — F520 Hypoactive sexual desire disorder: Secondary | ICD-10-CM | POA: Diagnosis not present

## 2024-05-01 DIAGNOSIS — G479 Sleep disorder, unspecified: Secondary | ICD-10-CM | POA: Diagnosis not present

## 2024-05-02 ENCOUNTER — Ambulatory Visit (HOSPITAL_BASED_OUTPATIENT_CLINIC_OR_DEPARTMENT_OTHER)
Admission: RE | Admit: 2024-05-02 | Discharge: 2024-05-02 | Disposition: A | Discharge status: Home / Self Care | Source: Ambulatory Visit | Attending: "Endocrinology | Admitting: "Endocrinology

## 2024-05-02 DIAGNOSIS — E042 Nontoxic multinodular goiter: Secondary | ICD-10-CM | POA: Insufficient documentation

## 2024-05-02 DIAGNOSIS — E041 Nontoxic single thyroid nodule: Secondary | ICD-10-CM | POA: Diagnosis not present

## 2024-05-24 ENCOUNTER — Encounter: Payer: Self-pay | Admitting: "Endocrinology

## 2024-05-31 ENCOUNTER — Encounter: Payer: Self-pay | Admitting: "Endocrinology

## 2024-05-31 ENCOUNTER — Ambulatory Visit (INDEPENDENT_AMBULATORY_CARE_PROVIDER_SITE_OTHER): Admitting: "Endocrinology

## 2024-05-31 VITALS — BP 116/80 | HR 74 | Ht 64.5 in | Wt 160.0 lb

## 2024-05-31 DIAGNOSIS — E042 Nontoxic multinodular goiter: Secondary | ICD-10-CM

## 2024-05-31 NOTE — Progress Notes (Signed)
 Outpatient Endocrinology Note Victoria Birmingham, MD  05/31/24   Victoria Parker [birth year] [id number]  Referring Provider: Ozell Heron HERO, MD Primary Care Provider: Ozell Heron HERO, MD Subjective  No chief complaint on file.   Assessment & Plan  Diagnoses and all orders for this visit:  Multinodular goiter    Victoria Parker is currently not taking any thyroid  medication. Patient is currently biochemically euthyroid, last checked 12/2023.  Educated on thyroid  axis.  No thyroid  medication indicated.   02/11/23 thyroid  ultrasound multinodular goiter. Nodule # 2 Location: Left; Mid 3.4 cm x 2.5 x 2.4 cm Dominant left-sided thyroid  nodule (labeled 2), potentially correlating with the palpable area of concern, meets imaging criteria to recommend percutaneous sampling as indicated. Neither of the additional punctate (sub 0.8 cm) thyroid  nodules meet imaging criteria to recommend percutaneous sampling or continued dedicated follow-up. 05/12/23: FNA Left mid 3.4cm x 2.5cm x 2.4cm,  TI-RADS 3: Benign follicular nodule (Bethesda category II)  04/05/24: thyroid  U/S reviewed images and report: Similar benign appearing hypoechoic nodule in the right superior thyroid  (labeled 1, 0.9 cm, previously 0.8 cm). Similar appearance of previously biopsied left inferior solid cystic thyroid  nodule (labeled 2, 3.4 cm, previously 3.4 cm). STABLE Similar benign appearing hypoechoic nodule in the left superior thyroid  (labeled 3, 0.9 cm, previously 0.6 cm). F/u in 1 year  I have reviewed current medications, nurse's notes, allergies, vital signs, past medical and surgical history, family medical history, and social history for this encounter. Counseled patient on symptoms, examination findings, lab findings, imaging results, treatment decisions and monitoring and prognosis. The patient understood the recommendations and agrees with the treatment plan. All questions regarding treatment plan were fully  answered.   Return in about 1 year (around 05/31/2025).   Victoria Birmingham, MD  05/31/24   I have reviewed current medications, nurse's notes, allergies, vital signs, past medical and surgical history, family medical history, and social history for this encounter. Counseled patient on symptoms, examination findings, lab findings, imaging results, treatment decisions and monitoring and prognosis. The patient understood the recommendations and agrees with the treatment plan. All questions regarding treatment plan were fully answered.   History of Present Illness Victoria Parker is a 48 y.o. year old female who presents to our clinic to follow up on multinodular goiter.    Patient reports history of thyroid  nodule around/earlier than 2018. Reports having a FNA done back in [place] around 2018 that was benign per pt.  Patient c/o anxiety and associated tremor and palpitation. Patient is getting treatment for it.  Symptoms suggestive of HYPOTHYROIDISM:  fatigue Yes weight gain No cold intolerance  Yes constipation  Yes, on iron meds  Symptoms suggestive of HYPERTHYROIDISM:  weight loss  No heat intolerance No hyperdefecation  No palpitations  Yes, when anxious   Compressive symptoms:  dysphagia  No dysphonia  No positional dyspnea (especially with simultaneous arms elevation)  No  Smokes  No On biotin  No Personal history of head/neck surgery/irradiation  No  Physical Exam  BP 116/80   Pulse 74   Ht 5' 4.5 (1.638 m)   Wt 160 lb (72.6 kg)   SpO2 96%   BMI 27.04 kg/m  Constitutional: well developed, well nourished Head: normocephalic, atraumatic, no exophthalmos Eyes: sclera anicteric, no redness Neck: no thyromegaly, no thyroid  tenderness; + nodule palpated Lungs: normal respiratory effort Neurology: alert and oriented, + fine hand tremor Skin: dry, no appreciable rashes Musculoskeletal: no appreciable defects Psychiatric: normal mood and affect  Allergies  Allergies   Allergen Reactions   Vicodin [Hydrocodone-Acetaminophen ] Anaphylaxis    Current Medications Patient's Medications  New Prescriptions   No medications on file  Previous Medications   BUPROPION (WELLBUTRIN SR) 150 MG 12 HR TABLET    Take 150 mg by mouth 2 (two) times daily.   CHOLECALCIFEROL 1.25 MG (50000 UT) CAPSULE    Take 1 capsule by mouth once a week.   FERROUS GLUCONATE (FERGON) 324 MG TABLET    Take by mouth.   FOLIC ACID (FOLVITE) 1 MG TABLET    Take 1 mg by mouth daily.   IBUPROFEN  (ADVIL ) 200 MG TABLET    Take 4 tablets (800 mg total) by mouth every 8 (eight) hours as needed for moderate pain.   IUD'S IU    by Intrauterine route. Brand name unknown   LISDEXAMFETAMINE (VYVANSE ) 40 MG CAPSULE    Take 1 capsule (40 mg total) by mouth every morning.   LISDEXAMFETAMINE (VYVANSE ) 40 MG CAPSULE    Take 1 capsule (40 mg total) by mouth every morning.   LISDEXAMFETAMINE (VYVANSE ) 40 MG CAPSULE    Take 1 capsule (40 mg total) by mouth every morning.   MULTIPLE VITAMIN (MULTIVITAMIN) TABLET    Take 1 tablet by mouth daily.   OMEGA-3 FATTY ACIDS (FISH OIL) 1000 MG CAPS    Take by mouth.   PROPRANOLOL  (INDERAL ) 10 MG TABLET    Take 1 tablet (10 mg total) by mouth every morning.  Modified Medications   No medications on file  Discontinued Medications   No medications on file    Past Medical History Past Medical History:  Diagnosis Date   ADHD    Anemia    Anxiety    Benign essential tremor    Depression    Endometrial polyp    Tremor     Past Surgical History Past Surgical History:  Procedure Laterality Date   CESAREAN SECTION  2004   DILATATION & CURETTAGE/HYSTEROSCOPY WITH MYOSURE N/A 09/11/2018   Procedure: DILATATION & CURETTAGE/HYSTEROSCOPY;  Surgeon: Johnnye Ade, MD;  Location: [facility];  Service: Gynecology;  Laterality: N/A;   LAPAROSCOPIC TUBAL LIGATION Bilateral 09/11/2018   Procedure: LAPAROSCOPIC TUBAL LIGATION with Filshie clips;  Surgeon:  Johnnye Ade, MD;  [facility];  Service: Gynecology;  Laterality: Bilateral;    Family History family history includes Crohn's disease in her niece; Diabetes in her father; Hypertension in her mother.  Social History Social History   Socioeconomic History   Marital status: Divorced    Spouse name: Not on file   Number of children: Not on file   Years of education: Not on file   Highest education level: Not on file  Occupational History   Not on file  Tobacco Use   Smoking status: Never   Smokeless tobacco: Never  Vaping Use   Vaping status: Never Used  Substance and Sexual Activity   Alcohol use: Never   Drug use: Never   Sexual activity: Yes    Birth control/protection: None  Other Topics Concern   Not on file  Social History Narrative   Not on file   Social Drivers of Health   Financial Resource Strain: Not on file  Food Insecurity: Low Risk  (01/06/2024)   Received from [facility]   Hunger Vital Sign    Within the past 12 months, you worried that your food would run out before you got money to buy more: Never true    Within  the past 12 months, the food you bought just didn't last and you didn't have money to get more. : Never true  Transportation Needs: No Transportation Needs (01/06/2024)   Received from [facility]    In the past 12 months, has lack of reliable transportation kept you from medical appointments, meetings, work or from getting things needed for daily living? : No  Physical Activity: Not on file  Stress: Not on file  Social Connections: Unknown (03/05/2022)   Received from [facility]   Social Network    Social Network: Not on file  Intimate Partner Violence: Unknown (03/05/2022)   Received from [facility]   HITS    Physically Hurt: Not on file    Insult or Talk Down To: Not on file    Threaten Physical Harm: Not on file    Scream or Curse: Not on file    Laboratory Investigations Lab  Results  Component Value Date   TSH 2.01 12/22/2023   TSH 2.22 04/25/2023   TSH 2.98 02/10/2023   FREET4 0.86 04/25/2023     No results found for: TSI   No components found for: TRAB   Lab Results  Component Value Date   CHOL 214 (H) 12/22/2023   Lab Results  Component Value Date   HDL 65.90 12/22/2023   Lab Results  Component Value Date   LDLCALC 131 (H) 12/22/2023   Lab Results  Component Value Date   TRIG 90.0 12/22/2023   Lab Results  Component Value Date   CHOLHDL 3 12/22/2023   Lab Results  Component Value Date   CREATININE 0.95 12/22/2023   Lab Results  Component Value Date   GFR 70.63 12/22/2023      Component Value Date/Time   NA 138 12/22/2023 1008   K 4.5 12/22/2023 1008   CL 103 12/22/2023 1008   CO2 29 12/22/2023 1008   GLUCOSE 87 12/22/2023 1008   BUN 18 12/22/2023 1008   CREATININE 0.95 12/22/2023 1008   CALCIUM 10.2 12/22/2023 1008   PROT 7.7 12/22/2023 1008   ALBUMIN 4.4 12/22/2023 1008   AST 17 12/22/2023 1008   ALT 9 12/22/2023 1008   ALKPHOS 83 12/22/2023 1008   BILITOT 0.4 12/22/2023 1008      Latest Ref Rng & Units 12/22/2023   10:08 AM  BMP  Glucose 70 - 99 mg/dL 87   BUN 6 - 23 mg/dL 18   Creatinine 9.59 - 1.20 mg/dL 9.04   Sodium 864 - 854 mEq/L 138   Potassium 3.5 - 5.1 mEq/L 4.5   Chloride 96 - 112 mEq/L 103   CO2 19 - 32 mEq/L 29   Calcium 8.4 - 10.5 mg/dL 89.7        Component Value Date/Time   WBC 6.2 12/22/2023 1008   RBC 4.36 12/22/2023 1008   HGB 12.8 12/22/2023 1008   HCT 38.7 12/22/2023 1008   PLT 298.0 12/22/2023 1008   MCV 88.7 12/22/2023 1008   MCH 26.9 09/11/2018 0548   MCHC 33.0 12/22/2023 1008   RDW 15.0 12/22/2023 1008   LYMPHSABS 1.6 12/22/2023 1008   MONOABS 0.5 12/22/2023 1008   EOSABS 0.2 12/22/2023 1008   BASOSABS 0.1 12/22/2023 1008      Parts of this note may have been dictated using voice recognition software. There may be variances in spelling and vocabulary which are  unintentional. Not all errors are proofread. Please notify the dino if any discrepancies are noted or if  the meaning of any statement is not clear.

## 2024-06-11 DIAGNOSIS — L659 Nonscarring hair loss, unspecified: Secondary | ICD-10-CM | POA: Diagnosis not present

## 2024-06-11 DIAGNOSIS — Z1329 Encounter for screening for other suspected endocrine disorder: Secondary | ICD-10-CM | POA: Diagnosis not present

## 2024-06-11 DIAGNOSIS — G479 Sleep disorder, unspecified: Secondary | ICD-10-CM | POA: Diagnosis not present

## 2024-06-11 DIAGNOSIS — F418 Other specified anxiety disorders: Secondary | ICD-10-CM | POA: Diagnosis not present

## 2024-08-08 ENCOUNTER — Encounter: Payer: Self-pay | Admitting: Family Medicine

## 2024-08-08 DIAGNOSIS — F900 Attention-deficit hyperactivity disorder, predominantly inattentive type: Secondary | ICD-10-CM

## 2024-08-08 MED ORDER — LISDEXAMFETAMINE DIMESYLATE 40 MG PO CAPS: 40.0000 mg | ORAL_CAPSULE | ORAL | 0 refills | Status: DC

## 2024-09-10 ENCOUNTER — Encounter: Payer: Self-pay | Admitting: Family Medicine

## 2024-09-10 ENCOUNTER — Ambulatory Visit: Admitting: Family Medicine

## 2024-09-10 VITALS — BP 92/68 | HR 70 | Temp 98.3°F | Ht 64.5 in | Wt 162.1 lb

## 2024-09-10 DIAGNOSIS — Z114 Encounter for screening for human immunodeficiency virus [HIV]: Secondary | ICD-10-CM

## 2024-09-10 DIAGNOSIS — D508 Other iron deficiency anemias: Secondary | ICD-10-CM | POA: Diagnosis not present

## 2024-09-10 DIAGNOSIS — Z1159 Encounter for screening for other viral diseases: Secondary | ICD-10-CM | POA: Diagnosis not present

## 2024-09-10 DIAGNOSIS — F900 Attention-deficit hyperactivity disorder, predominantly inattentive type: Secondary | ICD-10-CM | POA: Diagnosis not present

## 2024-09-10 MED ORDER — LISDEXAMFETAMINE DIMESYLATE 40 MG PO CAPS: 40.0000 mg | ORAL_CAPSULE | ORAL | 0 refills | Status: AC

## 2024-09-10 NOTE — Progress Notes (Signed)
 Established Patient Office Visit  Subjective   Patient ID: Victoria Parker, female    DOB: 06/25/1976  Age: 48 y.o. MRN: [id number]  Chief Complaint  Patient presents with   Medical Management of Chronic Issues    HPI Discussed the use of AI scribe software for clinical note transcription with the patient, who gave verbal consent to proceed.  History of Present Illness   Victoria Parker is a 48 year old female who presents for medication refills and concerns about ADHD medication efficacy.  She recently started using Ozempic for weight loss and is on her fourth dose, using extra supply from her brother's diabetes prescription without her own prescription. She has not had weight loss yet. She had nausea at first, which has partially improved, and she is worried about nausea and constipation with dose increases.  Over the past week she has noticed decreased effect of Vyvanse , with marked distraction and urges to overspend and race in her thinking. She wonders if this is related to starting Ozempic. She takes Vyvanse  40 mg daily and Wellbutrin 200 mg daily but often forgets the second Wellbutrin dose and only takes a morning dose.       Current Outpatient Medications  Medication Instructions   buPROPion (WELLBUTRIN SR) 200 mg, Oral, 2 times daily   Cholecalciferol 1.25 MG (50000 UT) capsule 1 capsule, Weekly   ferrous gluconate (FERGON) 324 MG tablet Take by mouth.   folic acid (FOLVITE) 1 mg, Daily   ibuprofen  (ADVIL ) 800 mg, Oral, Every 8 hours PRN   IUD'S IU by Intrauterine route. Brand name unknown   [START ON 11/05/2024] lisdexamfetamine (VYVANSE ) 40 mg, Oral, BH-each morning   [START ON 10/08/2024] lisdexamfetamine (VYVANSE ) 40 mg, Oral, BH-each morning   lisdexamfetamine (VYVANSE ) 40 mg, Oral, BH-each morning   Multiple Vitamin (MULTIVITAMIN) tablet 1 tablet, Daily   Omega-3 Fatty Acids (FISH OIL) 1000 MG CAPS Take by mouth.   propranolol  (INDERAL ) 10 mg, Oral, Every morning     Patient Active Problem List   Diagnosis Date Noted   Generalized anxiety disorder 02/11/2023   Thyromegaly 02/11/2023   Iron deficiency anemia 11/11/2022   Vitamin D  deficiency 11/11/2022   Tremor 11/11/2022   ADHD (attention deficit hyperactivity disorder), inattentive type 11/11/2022   Menorrhagia 09/11/2018     Review of Systems  All other systems reviewed and are negative.     Objective:     BP 92/68   Pulse 70   Temp 98.3 F (36.8 C) (Oral)   Ht 5' 4.5 (1.638 m)   Wt 162 lb 1.6 oz (73.5 kg)   SpO2 99%   BMI 27.39 kg/m    Physical Exam Vitals reviewed.  Constitutional:      Appearance: Normal appearance. She is well-groomed and normal weight.  Cardiovascular:     Rate and Rhythm: Normal rate and regular rhythm.     Pulses: Normal pulses.     Heart sounds: Normal heart sounds, S1 normal and S2 normal. No murmur heard. Pulmonary:     Effort: Pulmonary effort is normal.     Breath sounds: Normal breath sounds and air entry. No wheezing.  Neurological:     Mental Status: She is alert and oriented to person, place, and time. Mental status is at baseline.     Gait: Gait is intact.  Psychiatric:        Mood and Affect: Mood and affect normal.        Speech: Speech normal.  Behavior: Behavior normal.      No results found for any visits on 09/10/24.    The 10-year ASCVD risk score (Arnett DK, et al., 2019) is: 0.5%    Assessment & Plan:  Other iron deficiency anemia -     Iron, TIBC and Ferritin Panel; Future  ADHD (attention deficit hyperactivity disorder), inattentive type -     Lisdexamfetamine Dimesylate ; Take 1 capsule (40 mg total) by mouth every morning.  Dispense: 30 capsule; Refill: 0 -     Lisdexamfetamine Dimesylate ; Take 1 capsule (40 mg total) by mouth every morning.  Dispense: 30 capsule; Refill: 0 -     Lisdexamfetamine Dimesylate ; Take 1 capsule (40 mg total) by mouth every morning.  Dispense: 30 capsule; Refill:  0  Encounter for screening for HIV -     HIV Antibody (routine testing w rflx); Future  Need for hepatitis C screening test -     Hepatitis C antibody; Future   Assessment and Plan    Attention-deficit hyperactivity disorder, predominantly inattentive type Recent decrease in effectiveness of Vyvanse , possibly related to concurrent use of Wegovy. Symptoms include increased distractibility and urges of overspending. Potential interaction between [facility] and Vyvanse  due to effects on brain and absorption. Current dose of Vyvanse  is 40 mg, which has been effective previously. Wellbutrin is also being taken, which may contribute to dopamine stimulation. - Hold Wegovy for one week to assess if Vyvanse  effectiveness improves. - Monitor symptoms and consider increasing Vyvanse  dose if symptoms persist, but exercise caution due to concurrent Wellbutrin use. - Continue Wellbutrin 200 mg once daily. This is prescribed by her gynecologist.  Iron deficiency anemia Iron levels have not been checked since March. - Ordered blood work to check iron levels.  General Health Maintenance Discussed the importance of HIV and Hepatitis C screening as recommended by the CDC. - Ordered HIV and Hepatitis C screening.        Return in about 15 weeks (around 12/24/2024) for annual physical exam.    Heron CHRISTELLA Sharper, MD

## 2024-09-11 LAB — IRON,TIBC AND FERRITIN PANEL
%SAT: 20 % (ref 16–45)
Ferritin: 30 ng/mL (ref 16–232)
Iron: 72 ug/dL (ref 40–190)
TIBC: 355 ug/dL (ref 250–450)

## 2024-09-11 LAB — HIV ANTIBODY (ROUTINE TESTING W REFLEX)
HIV 1&2 Ab, 4th Generation: NONREACTIVE
HIV FINAL INTERPRETATION: NEGATIVE

## 2024-09-11 LAB — HEPATITIS C ANTIBODY: Hepatitis C Ab: NONREACTIVE

## 2024-09-12 ENCOUNTER — Ambulatory Visit: Payer: Self-pay | Admitting: Family Medicine

## 2024-12-25 ENCOUNTER — Encounter: Admitting: Family Medicine

## 2025-01-15 ENCOUNTER — Ambulatory Visit: Admitting: Dermatology

## 2025-05-30 ENCOUNTER — Ambulatory Visit: Admitting: "Endocrinology

## 2025-05-31 ENCOUNTER — Ambulatory Visit: Admitting: "Endocrinology
